# Patient Record
Sex: Female | Born: 1945 | Race: White | Hispanic: No | Marital: Married | Smoking: Never smoker
Health system: Southern US, Community
[De-identification: ages and names within clinical notes are randomized; demographics above are authoritative.]

## PROBLEM LIST (undated history)

## (undated) DIAGNOSIS — I639 Cerebral infarction, unspecified: Secondary | ICD-10-CM

## (undated) DIAGNOSIS — T7840XA Allergy, unspecified, initial encounter: Secondary | ICD-10-CM

## (undated) DIAGNOSIS — M1990 Unspecified osteoarthritis, unspecified site: Secondary | ICD-10-CM

## (undated) DIAGNOSIS — U071 COVID-19: Secondary | ICD-10-CM

## (undated) DIAGNOSIS — K469 Unspecified abdominal hernia without obstruction or gangrene: Secondary | ICD-10-CM

## (undated) DIAGNOSIS — Z8481 Family history of carrier of genetic disease: Secondary | ICD-10-CM

## (undated) DIAGNOSIS — Z86010 Personal history of colonic polyps: Secondary | ICD-10-CM

## (undated) DIAGNOSIS — K219 Gastro-esophageal reflux disease without esophagitis: Secondary | ICD-10-CM

## (undated) DIAGNOSIS — R32 Unspecified urinary incontinence: Secondary | ICD-10-CM

## (undated) DIAGNOSIS — J189 Pneumonia, unspecified organism: Secondary | ICD-10-CM

## (undated) DIAGNOSIS — N189 Chronic kidney disease, unspecified: Secondary | ICD-10-CM

## (undated) DIAGNOSIS — Z803 Family history of malignant neoplasm of breast: Secondary | ICD-10-CM

## (undated) DIAGNOSIS — K635 Polyp of colon: Secondary | ICD-10-CM

## (undated) DIAGNOSIS — K317 Polyp of stomach and duodenum: Secondary | ICD-10-CM

## (undated) DIAGNOSIS — E7800 Pure hypercholesterolemia, unspecified: Secondary | ICD-10-CM

## (undated) DIAGNOSIS — I10 Essential (primary) hypertension: Secondary | ICD-10-CM

## (undated) HISTORY — DX: Gastro-esophageal reflux disease without esophagitis: K21.9

## (undated) HISTORY — DX: Cerebral infarction, unspecified: I63.9

## (undated) HISTORY — DX: Family history of carrier of genetic disease: Z84.81

## (undated) HISTORY — DX: Allergy, unspecified, initial encounter: T78.40XA

## (undated) HISTORY — DX: Unspecified osteoarthritis, unspecified site: M19.90

## (undated) HISTORY — DX: COVID-19: U07.1

## (undated) HISTORY — PX: UPPER GASTROINTESTINAL ENDOSCOPY: SHX188

## (undated) HISTORY — PX: CHOLECYSTECTOMY: SHX55

## (undated) HISTORY — DX: Polyp of stomach and duodenum: K31.7

## (undated) HISTORY — PX: ABDOMINAL HYSTERECTOMY: SHX81

## (undated) HISTORY — DX: Personal history of colonic polyps: Z86.010

## (undated) HISTORY — PX: COLONOSCOPY: SHX174

## (undated) HISTORY — DX: Chronic kidney disease, unspecified: N18.9

## (undated) HISTORY — DX: Family history of malignant neoplasm of breast: Z80.3

## (undated) HISTORY — DX: Polyp of colon: K63.5

---

## 1996-01-25 HISTORY — PX: TRACHEOSTOMY: SUR1362

## 1996-01-25 HISTORY — PX: NISSEN FUNDOPLICATION: SHX2091

## 1997-04-24 ENCOUNTER — Encounter
Admission: RE | Admit: 1997-04-24 | Discharge: 1997-07-23 | Payer: Self-pay | Admitting: Physical Medicine and Rehabilitation

## 1997-11-10 ENCOUNTER — Inpatient Hospital Stay (HOSPITAL_COMMUNITY): Admission: RE | Admit: 1997-11-10 | Discharge: 1997-11-13 | Payer: Self-pay | Admitting: Orthopedic Surgery

## 1999-01-06 ENCOUNTER — Ambulatory Visit (HOSPITAL_COMMUNITY): Admission: RE | Admit: 1999-01-06 | Discharge: 1999-01-06 | Payer: Self-pay | Admitting: *Deleted

## 1999-01-21 ENCOUNTER — Other Ambulatory Visit: Admission: RE | Admit: 1999-01-21 | Discharge: 1999-01-21 | Payer: Self-pay | Admitting: *Deleted

## 1999-03-23 ENCOUNTER — Ambulatory Visit (HOSPITAL_COMMUNITY): Admission: RE | Admit: 1999-03-23 | Discharge: 1999-03-23 | Payer: Self-pay | Admitting: *Deleted

## 1999-03-23 ENCOUNTER — Encounter: Payer: Self-pay | Admitting: *Deleted

## 2000-12-07 ENCOUNTER — Encounter: Payer: Self-pay | Admitting: Family Medicine

## 2000-12-07 ENCOUNTER — Ambulatory Visit (HOSPITAL_COMMUNITY): Admission: RE | Admit: 2000-12-07 | Discharge: 2000-12-07 | Payer: Self-pay | Admitting: Family Medicine

## 2001-03-30 ENCOUNTER — Other Ambulatory Visit: Admission: RE | Admit: 2001-03-30 | Discharge: 2001-03-30 | Payer: Self-pay | Admitting: Family Medicine

## 2001-04-05 ENCOUNTER — Encounter: Admission: RE | Admit: 2001-04-05 | Discharge: 2001-04-05 | Payer: Self-pay | Admitting: Family Medicine

## 2001-04-05 ENCOUNTER — Encounter: Payer: Self-pay | Admitting: Family Medicine

## 2009-04-09 ENCOUNTER — Ambulatory Visit: Payer: Self-pay | Admitting: Diagnostic Radiology

## 2009-04-09 ENCOUNTER — Ambulatory Visit (HOSPITAL_BASED_OUTPATIENT_CLINIC_OR_DEPARTMENT_OTHER): Admission: RE | Admit: 2009-04-09 | Discharge: 2009-04-09 | Payer: Self-pay | Admitting: Family Medicine

## 2009-10-15 ENCOUNTER — Ambulatory Visit (HOSPITAL_COMMUNITY): Admission: RE | Admit: 2009-10-15 | Discharge: 2009-10-15 | Payer: Self-pay | Admitting: Family Medicine

## 2010-11-15 ENCOUNTER — Other Ambulatory Visit (HOSPITAL_COMMUNITY): Payer: Self-pay | Admitting: Family Medicine

## 2010-11-15 DIAGNOSIS — Z1231 Encounter for screening mammogram for malignant neoplasm of breast: Secondary | ICD-10-CM

## 2010-12-07 ENCOUNTER — Ambulatory Visit (HOSPITAL_COMMUNITY)
Admission: RE | Admit: 2010-12-07 | Discharge: 2010-12-07 | Disposition: A | Discharge status: Home / Self Care | Payer: Medicare Other | Source: Ambulatory Visit | Attending: Family Medicine | Admitting: Family Medicine

## 2010-12-07 DIAGNOSIS — Z1231 Encounter for screening mammogram for malignant neoplasm of breast: Secondary | ICD-10-CM | POA: Insufficient documentation

## 2011-01-19 ENCOUNTER — Emergency Department (HOSPITAL_COMMUNITY)
Admission: EM | Admit: 2011-01-19 | Discharge: 2011-01-19 | Disposition: A | Discharge status: Home / Self Care | Payer: Medicare Other | Attending: Emergency Medicine | Admitting: Emergency Medicine

## 2011-01-19 ENCOUNTER — Encounter: Payer: Self-pay | Admitting: Emergency Medicine

## 2011-01-19 ENCOUNTER — Other Ambulatory Visit: Payer: Self-pay

## 2011-01-19 DIAGNOSIS — Z79899 Other long term (current) drug therapy: Secondary | ICD-10-CM | POA: Insufficient documentation

## 2011-01-19 DIAGNOSIS — R5381 Other malaise: Secondary | ICD-10-CM | POA: Insufficient documentation

## 2011-01-19 DIAGNOSIS — I10 Essential (primary) hypertension: Secondary | ICD-10-CM | POA: Insufficient documentation

## 2011-01-19 DIAGNOSIS — R531 Weakness: Secondary | ICD-10-CM

## 2011-01-19 DIAGNOSIS — E7800 Pure hypercholesterolemia, unspecified: Secondary | ICD-10-CM | POA: Insufficient documentation

## 2011-01-19 HISTORY — DX: Pure hypercholesterolemia, unspecified: E78.00

## 2011-01-19 HISTORY — DX: Unspecified urinary incontinence: R32

## 2011-01-19 HISTORY — DX: Essential (primary) hypertension: I10

## 2011-01-19 LAB — DIFFERENTIAL
Basophils Absolute: 0.1 10*3/uL (ref 0.0–0.1)
Basophils Relative: 1 % (ref 0–1)
Neutro Abs: 7.8 10*3/uL — ABNORMAL HIGH (ref 1.7–7.7)
Neutrophils Relative %: 71 % (ref 43–77)

## 2011-01-19 LAB — BASIC METABOLIC PANEL
Chloride: 104 mEq/L (ref 96–112)
Creatinine, Ser: 1.77 mg/dL — ABNORMAL HIGH (ref 0.50–1.10)
GFR calc Af Amer: 34 mL/min — ABNORMAL LOW (ref >90)
Potassium: 3.9 mEq/L (ref 3.5–5.1)
Sodium: 137 mEq/L (ref 135–145)

## 2011-01-19 LAB — URINALYSIS, ROUTINE W REFLEX MICROSCOPIC
Ketones, ur: NEGATIVE mg/dL
Nitrite: NEGATIVE
Specific Gravity, Urine: 1.02 (ref 1.005–1.030)
pH: 6.5 (ref 5.0–8.0)

## 2011-01-19 LAB — CBC
MCHC: 32 g/dL (ref 30.0–36.0)
RDW: 14.2 % (ref 11.5–15.5)

## 2011-01-19 LAB — URINE MICROSCOPIC-ADD ON

## 2011-01-19 MED ORDER — SODIUM CHLORIDE 0.9 % IV BOLUS (SEPSIS)
500.0000 mL | Freq: Once | INTRAVENOUS | Status: AC
  Administered 2011-01-19: 500 mL via INTRAVENOUS

## 2011-01-19 MED ORDER — SODIUM CHLORIDE 0.9 % IV SOLN
INTRAVENOUS | Status: DC
  Administered 2011-01-19: 17:00:00 via INTRAVENOUS

## 2011-01-19 MED ORDER — ONDANSETRON HCL 4 MG/2ML IJ SOLN
4.0000 mg | Freq: Once | INTRAMUSCULAR | Status: AC
  Administered 2011-01-19: 4 mg via INTRAVENOUS
  Filled 2011-01-19: qty 2

## 2011-01-19 NOTE — ED Provider Notes (Signed)
History     CSN: [id number]  Arrival date & time 01/19/11  1552   First MD Initiated Contact with Patient 01/19/11 1609      Chief Complaint  Patient presents with  . Fatigue    (Consider location/radiation/quality/duration/timing/severity/associated sxs/prior treatment) HPI Comments: She was at her urologist office, getting acupuncture to her feet for a bladder problem, when she began to feel weak. She was transferred here by EMS, for evaluation. No paperwork was transferred with her. She does not feeling better since arrival.  Patient is a 65 y.o. female presenting with weakness.  Weakness The symptoms began less than 1 hour ago. The symptoms are unchanged. The neurological symptoms are diffuse.  Additional symptoms include weakness. Additional symptoms do not include lower back pain, leg pain, tinnitus, vertigo, anxiety or dysphoric mood.   during EMS transport. She received a 250 cc bolus. She has had feelings like this in the past, but never while getting acupuncture. She denies illness recently, including headache, cough, shortness of breath, chest pain, abdominal pain, back pain, change in bowel or urinary habits. Her last bowel movement was this morning, and normal.  Past Medical History  Diagnosis Date  . Hypertension   . Hypercholesterolemia   . Incontinence     Past Surgical History  Procedure Date  . Fundoplication 1998    total    History reviewed. No pertinent family history.  History  Substance Use Topics  . Smoking status: Never Smoker   . Smokeless tobacco: Never Used  . Alcohol Use: No    OB History    Grav Para Term Preterm Abortions TAB SAB Ect Mult Living                  Review of Systems  HENT: Negative for tinnitus.   Neurological: Positive for weakness. Negative for vertigo.  Psychiatric/Behavioral: Negative for dysphoric mood.  All other systems reviewed and are negative.    Allergies  Review of patient's allergies indicates no  known allergies.  Home Medications   Current Outpatient Rx  Name Route Sig Dispense Refill  . EZETIMIBE 10 MG PO TABS Oral Take 10 mg by mouth daily.      . OXYBUTYNIN CHLORIDE ER 10 MG PO TB24 Oral Take 10 mg by mouth daily.      Marland Kitchen PRAVASTATIN SODIUM 80 MG PO TABS Oral Take 80 mg by mouth daily.      Marland Kitchen VALSARTAN-HYDROCHLOROTHIAZIDE 320-12.5 MG PO TABS Oral Take 1 tablet by mouth daily.        BP 115/68  Pulse 87  Temp(Src) 97.1 F (36.2 C) (Axillary)  Resp 20  SpO2 97%  Physical Exam  Nursing note and vitals reviewed. Constitutional: She is oriented to person, place, and time. She appears well-developed and well-nourished.       Mild weakness, requiring help to sit up, but then she can't support herself in a seated position  HENT:  Head: Normocephalic and atraumatic.  Eyes: Conjunctivae and EOM are normal. Pupils are equal, round, and reactive to light.  Neck: Normal range of motion and phonation normal. Neck supple.  Cardiovascular: Normal rate, regular rhythm and intact distal pulses.   Pulmonary/Chest: Effort normal and breath sounds normal. She exhibits no tenderness.  Abdominal: Soft. Bowel sounds are normal. She exhibits no distension. There is no tenderness. There is no guarding.  Musculoskeletal: Normal range of motion.       Feet are without visible wounds from her recent acupuncture  Neurological: She  is alert and oriented to person, place, and time. She has normal strength and normal reflexes. She exhibits normal muscle tone.  Skin: Skin is warm and dry.  Psychiatric: Her behavior is normal. Judgment and thought content normal.       She appears sad    ED Course  Procedures (including critical care time) ED treatment: IV fluids. At discharge. Patient was alert, calm, cooperative, and mature without distress and tolerating liquids. Labs Reviewed  CBC - Abnormal; Notable for the following:    WBC 11.0 (*)    RBC 3.47 (*)    Hemoglobin 10.3 (*)    HCT 32.2 (*)      All other components within normal limits  DIFFERENTIAL - Abnormal; Notable for the following:    Neutro Abs 7.8 (*)    All other components within normal limits  BASIC METABOLIC PANEL - Abnormal; Notable for the following:    Glucose, Bld 116 (*)    BUN 33 (*)    Creatinine, Ser 1.77 (*)    GFR calc non Af Amer 29 (*)    GFR calc Af Amer 34 (*)    All other components within normal limits  URINALYSIS, ROUTINE W REFLEX MICROSCOPIC - Abnormal; Notable for the following:    Leukocytes, UA TRACE (*)    All other components within normal limits  URINE MICROSCOPIC-ADD ON  URINE CULTURE   No results found.   1. Weakness       MDM  Nonspecific weakness, with negative EP evaluation. Doubt toxic, metabolic or infectious processes.        Flint Melter, MD 01/19/11 2107

## 2011-01-19 NOTE — ED Notes (Signed)
Pt goes to Dr. Hollie Beach office Q3wks for accupuncture to feet for bladder control problems.  States she has been feeling fine.  Denies being sick at all.  States the procedure was finished, the nurse came in to remove the needle(s) and she became nauseated and weak.  Family states she is normally pale, but she was very pale then.  Family states her color has returned.  Pt c/o feeling weak, wanting to sleep and nausea-states she is unable to vomit b/c of a procedure she's had in the past.  Denies any pain. Denies noticing any blood in urine or stools.

## 2011-01-19 NOTE — ED Notes (Signed)
Pt was having a procedure done at urology center and became weak denies any pain, states that this happened 2 yrs ago but nothing was done, alert x4, iv 20g rt ac ns 200 cc bolus given bp 108/68, dizzyness, pt pale

## 2011-01-21 LAB — URINE CULTURE: Culture  Setup Time: 201212270436

## 2011-06-02 ENCOUNTER — Encounter (INDEPENDENT_AMBULATORY_CARE_PROVIDER_SITE_OTHER): Payer: Self-pay | Admitting: Surgery

## 2011-06-02 ENCOUNTER — Ambulatory Visit (INDEPENDENT_AMBULATORY_CARE_PROVIDER_SITE_OTHER): Payer: Medicare Other | Admitting: Surgery

## 2011-06-02 VITALS — BP 140/82 | HR 76 | Temp 98.5°F | Resp 16 | Ht 62.5 in | Wt 170.6 lb

## 2011-06-02 DIAGNOSIS — K223 Perforation of esophagus: Secondary | ICD-10-CM

## 2011-06-02 DIAGNOSIS — Z930 Tracheostomy status: Secondary | ICD-10-CM

## 2011-06-02 DIAGNOSIS — Z9889 Other specified postprocedural states: Secondary | ICD-10-CM

## 2011-06-02 DIAGNOSIS — Z931 Gastrostomy status: Secondary | ICD-10-CM | POA: Insufficient documentation

## 2011-06-02 DIAGNOSIS — Z8673 Personal history of transient ischemic attack (TIA), and cerebral infarction without residual deficits: Secondary | ICD-10-CM

## 2011-06-02 HISTORY — DX: Gastrostomy status: Z93.1

## 2011-06-02 HISTORY — DX: Other specified postprocedural states: Z98.890

## 2011-06-02 HISTORY — DX: Personal history of transient ischemic attack (TIA), and cerebral infarction without residual deficits: Z86.73

## 2011-06-02 NOTE — Progress Notes (Signed)
Chief Complaint:  Recent recurrent GER  History of Present Illness:  Kristie Anderson is an 66 y.o. female who underwent a Nissen by Dr. Lurene Shadow complicated by esophageal puncture and prolonged ventilation (Sept-Jan 99)  She has recently developed recurrent GER and is on Dexilant  Past Medical History  Diagnosis Date  . Hypertension   . Hypercholesterolemia   . Incontinence   . GERD (gastroesophageal reflux disease)   . Arthritis   . Stroke     Past Surgical History  Procedure Date  . Fundoplication 1998    total  . Cholecystectomy     Current Outpatient Prescriptions  Medication Sig Dispense Refill  . aspirin 81 MG tablet Take 81 mg by mouth daily.      . Cholecalciferol (VITAMIN D PO) Take by mouth daily.      [name] 18-103 MCG/ACT inhaler Ad lib.      Marland Kitchen dexlansoprazole (DEXILANT) 60 MG capsule Take 60 mg by mouth daily.      Marland Kitchen ezetimibe (ZETIA) 10 MG tablet Take 10 mg by mouth daily.        Marland Kitchen loratadine (CLARITIN) 10 MG tablet Take 10 mg by mouth as needed.      Marland Kitchen oxybutynin (DITROPAN-XL) 10 MG 24 hr tablet Take 10 mg by mouth daily.        . pravastatin (PRAVACHOL) 80 MG tablet Take 80 mg by mouth daily.        . promethazine (PHENERGAN) 12.5 MG tablet Ad lib.      . valsartan-hydrochlorothiazide (DIOVAN-HCT) 320-12.5 MG per tablet Take 1 tablet by mouth daily.         Review of patient's allergies indicates no known allergies. Family History  Problem Relation Age of Onset  . Heart disease Mother   . Cancer Mother     breast  . Heart disease Father   . Cancer Daughter     breast   Social History:   reports that she has never smoked. She has never used smokeless tobacco. She reports that she does not drink alcohol or use illicit drugs.   REVIEW OF SYSTEMS - PERTINENT POSITIVES ONLY: Will try to retrieve old chart  Physical Exam:   Blood pressure 140/82, pulse 76, temperature 98.5 F (36.9 C), temperature source Temporal, resp. rate 16, height 5' 2.5" (1.588  m), weight 170 lb 9.6 oz (77.384 kg). Body mass index is 30.71 kg/(m^2).  Gen:  WDWN WF NAD  Neurological: Alert and oriented to person, place, and time. Motor and sensory function is grossly intact  Head: Normocephalic and atraumatic.  Eyes: Conjunctivae are normal. Pupils are equal, round, and reactive to light. No scleral icterus.  Neck: Normal range of motion. Neck supple. No tracheal deviation or thyromegaly present.  Cardiovascular:  SR without murmurs or gallops.  No carotid bruits Respiratory: Effort normal.  No respiratory distress. No chest wall tenderness. Breath sounds normal.  No wheezes, rales or rhonchi.  Abdomen:  nontender GU: Musculoskeletal: Normal range of motion. Extremities are nontender. No cyanosis, edema or clubbing noted Lymphadenopathy: No cervical, preauricular, postauricular or axillary adenopathy is present Skin: Skin is warm and dry. No rash noted. No diaphoresis. No erythema. No pallor. Pscyh: Normal mood and affect. Behavior is normal. Judgment and thought content normal.   LABORATORY RESULTS: No results found for this or any previous visit (from the past 48 hour(s)).  RADIOLOGY RESULTS: No results found.  Problem List: There is no problem list on file for this patient.  Assessment & Plan: Recurrent GER after apparent attempted Nissen in 1999.  First step in this reevaluation is to get an UGI and I will see her after that.      Matt B. Daphine Deutscher, MD, [facility] [phone] beeper [phone]  06/02/2011 10:38 AM

## 2011-06-06 ENCOUNTER — Ambulatory Visit
Admission: RE | Admit: 2011-06-06 | Discharge: 2011-06-06 | Disposition: A | Discharge status: Home / Self Care | Payer: Medicare Other | Source: Ambulatory Visit | Attending: Surgery | Admitting: Surgery

## 2011-06-06 DIAGNOSIS — K223 Perforation of esophagus: Secondary | ICD-10-CM

## 2011-06-23 ENCOUNTER — Ambulatory Visit (INDEPENDENT_AMBULATORY_CARE_PROVIDER_SITE_OTHER): Payer: Medicare Other | Admitting: Surgery

## 2011-06-23 ENCOUNTER — Encounter (INDEPENDENT_AMBULATORY_CARE_PROVIDER_SITE_OTHER): Payer: Self-pay | Admitting: Surgery

## 2011-06-23 VITALS — BP 140/80 | HR 84 | Temp 98.0°F | Ht 62.5 in | Wt 172.2 lb

## 2011-06-23 DIAGNOSIS — R143 Flatulence: Secondary | ICD-10-CM

## 2011-06-23 DIAGNOSIS — R141 Gas pain: Secondary | ICD-10-CM

## 2011-06-23 DIAGNOSIS — R140 Abdominal distension (gaseous): Secondary | ICD-10-CM

## 2011-06-23 DIAGNOSIS — K222 Esophageal obstruction: Secondary | ICD-10-CM

## 2011-06-23 NOTE — Progress Notes (Signed)
Kristie Anderson return today I reviewed her upper GI series. He showed a stricture at the site of her previous perforation by Dr. Lurene Shadow. Her main complaints have been bloating and discomfort in her upper abdomen. I think she would benefit from an endoscopy and to check her for H. pylori. Will refer  her to Dr. Matthias Hughs for this.  Impression: Bloating and distention.  Survived Nissen perforation by [employer].  Now with stricture of esophagus.  Plan:  Refer to Dr. Matthias Hughs for evaluation and EGD she were to lower rate it for you on top of it

## 2011-08-25 ENCOUNTER — Encounter (INDEPENDENT_AMBULATORY_CARE_PROVIDER_SITE_OTHER): Payer: Medicare Other | Admitting: Surgery

## 2011-09-06 ENCOUNTER — Encounter (INDEPENDENT_AMBULATORY_CARE_PROVIDER_SITE_OTHER): Payer: Self-pay

## 2011-09-22 ENCOUNTER — Encounter (INDEPENDENT_AMBULATORY_CARE_PROVIDER_SITE_OTHER): Payer: Medicare Other | Admitting: Surgery

## 2011-11-21 ENCOUNTER — Other Ambulatory Visit (HOSPITAL_COMMUNITY): Payer: Self-pay | Admitting: Family Medicine

## 2011-11-21 DIAGNOSIS — Z1231 Encounter for screening mammogram for malignant neoplasm of breast: Secondary | ICD-10-CM

## 2011-12-09 ENCOUNTER — Ambulatory Visit (HOSPITAL_COMMUNITY): Payer: Medicare Other

## 2012-01-02 ENCOUNTER — Ambulatory Visit (HOSPITAL_COMMUNITY)
Admission: RE | Admit: 2012-01-02 | Discharge: 2012-01-02 | Disposition: A | Discharge status: Home / Self Care | Payer: Medicare Other | Source: Ambulatory Visit | Attending: Family Medicine | Admitting: Family Medicine

## 2012-01-02 DIAGNOSIS — Z1231 Encounter for screening mammogram for malignant neoplasm of breast: Secondary | ICD-10-CM | POA: Insufficient documentation

## 2012-02-14 ENCOUNTER — Other Ambulatory Visit: Payer: Self-pay | Admitting: Nephrology

## 2012-02-14 DIAGNOSIS — I10 Essential (primary) hypertension: Secondary | ICD-10-CM

## 2012-02-16 ENCOUNTER — Other Ambulatory Visit: Payer: Medicare Other

## 2012-02-21 ENCOUNTER — Ambulatory Visit
Admission: RE | Admit: 2012-02-21 | Discharge: 2012-02-21 | Disposition: A | Discharge status: Home / Self Care | Payer: Medicare Other | Source: Ambulatory Visit | Attending: Nephrology | Admitting: Nephrology

## 2012-02-21 DIAGNOSIS — I10 Essential (primary) hypertension: Secondary | ICD-10-CM

## 2012-05-19 ENCOUNTER — Other Ambulatory Visit: Payer: Self-pay | Admitting: Family Medicine

## 2012-05-23 ENCOUNTER — Telehealth: Payer: Self-pay | Admitting: *Deleted

## 2012-05-23 NOTE — Telephone Encounter (Signed)
PT NEEDS REFILLS DONE THIS WEEK GOING OUT OF TOWN NEXT WEEK IS OUT OF RX'S -  PANTOPRAZOLE SODIUM-  FENOFIBRIC-  PHARMACY USED- [facility] [address] [place]

## 2012-05-26 ENCOUNTER — Other Ambulatory Visit: Payer: Self-pay | Admitting: Family Medicine

## 2012-06-04 NOTE — Telephone Encounter (Signed)
PT CALLED AGAIN AND SAID THAT SHE NEVER GOT THESE RX'S CALLED IN -  SEE FIRST NOTE- PT ALSO NEEDS A REFILL ON  ZETIA 10MG - GENERIC [facility] ON [address] IN [place] PLEASE ADVISE AND OR CONTACT PT.  THANKS  PT# [phone]

## 2012-06-05 ENCOUNTER — Telehealth: Payer: Self-pay | Admitting: *Deleted

## 2012-06-05 MED ORDER — PANTOPRAZOLE SODIUM 40 MG PO TBEC: 40.0000 mg | DELAYED_RELEASE_TABLET | Freq: Every day | ORAL | Status: DC

## 2012-06-05 MED ORDER — CHOLINE FENOFIBRATE 135 MG PO CPDR: 135.0000 mg | DELAYED_RELEASE_CAPSULE | Freq: Every day | ORAL | Status: DC

## 2012-06-05 MED ORDER — EZETIMIBE 10 MG PO TABS: 10.0000 mg | ORAL_TABLET | Freq: Every day | ORAL | Status: DC

## 2012-06-05 NOTE — Telephone Encounter (Signed)
Please let her know we can only refill her meds for 30 days.  She needs an appt to get all her meds refilled. Make sure to remind her to bring all her med bottles. Thanks

## 2012-06-21 ENCOUNTER — Other Ambulatory Visit: Payer: Self-pay | Admitting: *Deleted

## 2012-06-21 DIAGNOSIS — I10 Essential (primary) hypertension: Secondary | ICD-10-CM

## 2012-06-21 DIAGNOSIS — E785 Hyperlipidemia, unspecified: Secondary | ICD-10-CM

## 2012-06-22 ENCOUNTER — Other Ambulatory Visit: Payer: Medicare Other

## 2012-06-22 LAB — COMPREHENSIVE METABOLIC PANEL
ALT: 10 U/L (ref 0–35)
AST: 16 U/L (ref 0–37)
Albumin: 4.3 g/dL (ref 3.5–5.2)
Alkaline Phosphatase: 50 U/L (ref 39–117)
BUN: 28 mg/dL — ABNORMAL HIGH (ref 6–23)
CO2: 29 mEq/L (ref 19–32)
Calcium: 9.8 mg/dL (ref 8.4–10.5)
Chloride: 103 mEq/L (ref 96–112)
Creat: 1.51 mg/dL — ABNORMAL HIGH (ref 0.50–1.10)
Glucose, Bld: 88 mg/dL (ref 70–99)
Potassium: 4.6 mEq/L (ref 3.5–5.3)
Sodium: 141 mEq/L (ref 135–145)
Total Bilirubin: 0.4 mg/dL (ref 0.3–1.2)
Total Protein: 6.9 g/dL (ref 6.0–8.3)

## 2012-06-22 LAB — LIPID PANEL
Cholesterol: 138 mg/dL (ref 0–200)
HDL: 47 mg/dL (ref >39)
LDL Cholesterol: 70 mg/dL (ref 0–99)
Total CHOL/HDL Ratio: 2.9 Ratio
Triglycerides: 107 mg/dL (ref <150)
VLDL: 21 mg/dL (ref 0–40)

## 2012-06-23 LAB — MICROALBUMIN, URINE: Microalb, Ur: 0.5 mg/dL (ref 0.00–1.89)

## 2012-06-29 ENCOUNTER — Encounter: Payer: Self-pay | Admitting: Family Medicine

## 2012-06-29 ENCOUNTER — Ambulatory Visit (INDEPENDENT_AMBULATORY_CARE_PROVIDER_SITE_OTHER): Payer: Medicare Other | Admitting: Family Medicine

## 2012-06-29 VITALS — BP 134/78 | HR 68 | Wt 168.0 lb

## 2012-06-29 DIAGNOSIS — I10 Essential (primary) hypertension: Secondary | ICD-10-CM

## 2012-06-29 DIAGNOSIS — K219 Gastro-esophageal reflux disease without esophagitis: Secondary | ICD-10-CM

## 2012-06-29 DIAGNOSIS — E785 Hyperlipidemia, unspecified: Secondary | ICD-10-CM

## 2012-06-29 MED ORDER — CHOLINE FENOFIBRATE 135 MG PO CPDR: 135.0000 mg | DELAYED_RELEASE_CAPSULE | Freq: Every day | ORAL | Status: DC

## 2012-06-29 MED ORDER — PRAVASTATIN SODIUM 80 MG PO TABS: 80.0000 mg | ORAL_TABLET | Freq: Every day | ORAL | Status: DC

## 2012-06-29 MED ORDER — IRBESARTAN 150 MG PO TABS: 150.0000 mg | ORAL_TABLET | Freq: Every day | ORAL | Status: DC

## 2012-06-29 MED ORDER — EZETIMIBE 10 MG PO TABS: 10.0000 mg | ORAL_TABLET | Freq: Every day | ORAL | Status: DC

## 2012-06-29 MED ORDER — PANTOPRAZOLE SODIUM 40 MG PO TBEC: 40.0000 mg | DELAYED_RELEASE_TABLET | Freq: Every day | ORAL | Status: DC

## 2012-06-29 NOTE — Patient Instructions (Addendum)
1)  Cholesterol - Your numbers are perfect so I would stay on your current dosages assuming you feel good on them.   2)  Kidney Function - Your kidney function has improved from your last test in the fall.  Please give Dr. Darrick Penna a copy of your labs.     Kidney Disease, Adult The kidneys are two organs that lie on either side of the spine between the middle of the back and the front of the abdomen. The kidneys:   Remove wastes and extra water from the blood.   Produce important hormones. These regulate blood pressure, help keep bones strong, and help create red blood cells.   Balance the fluids and chemicals in the blood and tissues. Kidney disease occurs when the kidneys are damaged. Kidney damage may be sudden (acute) or develop over a long period (chronic). A small amount of damage may not cause problems, but a large amount of damage may make it difficult or impossible for the kidneys to work the way they should. Early detection and treatment of kidney disease may prevent kidney damage from becoming permanent or getting worse. Some kidney diseases are curable, but most are not. Many people with kidney disease are able to control the disease and live a normal life.  TYPES OF KIDNEY DISEASE  Acute kidney injury.Acute kidney injury occurs when there is sudden damage to the kidneys.  Chronic kidney disease. Chronic kidney disease occurs when the kidneys are damaged over a long period.  End-stage kidney disease. End-stage kidney disease occurs when the kidneys are so damaged that they stop working. In end-stage kidney disease, the kidneys cannot get better. CAUSES Any condition, disease, or event that damages the kidneys may cause kidney disease. Acute kidney injury.  A problem with blood flow to the kidneys. This may be caused by:   Blood loss.   Heart disease.   Severe burns.   Liver disease.  Direct damage to the kidneys. This may be caused by:  Some medicines.   A  kidney infection.   Poisoning or consuming toxic substances.   A surgical wound.   A blow to the kidney area.   A problem with urine flow. This may be caused by:   Cancer.   Kidney stones.   An enlarged prostate. Chronic kidney disease. The most common causes of chronic kidney disease are diabetes and high blood pressure (hypertension). Chronic kidney disease may also be caused by:   Diseases that cause the filtering units of the kidneys to become inflamed.   Diseases that affect the immune system.   Genetic diseases.   Medicines that damage the kidneys, such as anti-inflammatory medicines.  Poisoning or exposure to toxic substances.   A reoccurring kidney or urinary infection.   A problem with urine flow. This may be caused by:  Cancer.   Kidney stones.   An enlarged prostate in males. End-stage kidney disease. This kidney disease usually occurs when a chronic kidney disease gets worse. It may also occur after acute kidney injury.  SYMPTOMS   Swelling (edema) of the legs, ankles, or feet.   Tiredness (lethargy).   Nausea or vomiting.   Confusion.   Problems with urination, such as:   Painful or burning feeling during urination.   Decreased urine production.  Bloody urine.   Frequent urination, especially at night.  Hypertension.  Muscle twitches and cramps.   Shortness of breath.   Persistent itchiness.   Loss of appetite.  Metallic taste in the  mouth.   Weakness.   Seizures.   Chest pain or pressure.   Trouble sleeping.   Headaches.   Abnormally dark or light skin.   Numbness in the hands or feet.   Easy bruising.   Frequent hiccups.   Menstruation stops. Sometimes, no symptoms are present. DIAGNOSIS  Kidney disease may be detected and diagnosed by tests, including blood, urine, imaging, or kidney biopsy tests.  TREATMENT  Acute kidney injury. Treatment of acute kidney injury varies  depending on the cause and severity of the kidney damage. In mild cases, no treatment may be needed. The kidneys may heal on their own. If acute kidney injury is more severe, your caregiver will treat the cause of the kidney damage, help the kidneys heal, and prevent complications from occurring. Severe cases may require a procedure to remove toxic wastes from the body (dialysis) or surgery to repair kidney damage. Surgery may involve:   Repair of a torn kidney.   Removal of an obstruction.  Most of the time, you will need to stay overnight at the hospital.  Chronic kidney disease. Most chronic kidney diseases cannot be cured. Treatment usually involves relieving symptoms and preventing or slowing the progression of the disease. Treatment may include:   A special diet. You may need to avoid alcohol and foods that:   Have added salt.   Are high in potassium.   Are high in protein.   Medicines. These may:   Lower blood pressure.   Relieve anemia.   Relieve swelling.   Protect the bones.  End-stage kidney disease. End-stage kidney disease is life-threatening and must be treated immediately. There are two treatments for end-stage kidney disease:   Dialysis.   Receiving a new kidney (kidney transplant). Both of these treatments have serious risks and consequences. In addition to having dialysis or a kidney transplant, you may need to take medicines to control hypertension and cholesterol and to decrease phosphorus levels in your blood. LENGTH OF ILLNESS  Acute kidney injury.The length of this disease varies greatly from person to person. Exactly how long it lasts depends on the cause of the kidney damage. Acute kidney injury may develop into chronic kidney disease or end-stage kidney disease.  Chronic kidney disease. This disease usually lasts a lifetime. Chronic kidney disease may worsen over time to become end-stage kidney disease. The time it takes for end-stage kidney  disease to develop varies from person to person.  End-stage kidney disease. This disease lasts until a kidney transplant is performed. PREVENTION  Kidney disease can sometimes be prevented. If you have diabetes, hypertension, or any other condition that may lead to kidney disease, you should try to prevent kidney disease with:   An appropriate diet.  Medicine.  Lifestyle changes. FOR MORE INFORMATION  American Association of Kidney Patients: ResidentialShow.is  National Kidney Foundation: www.kidney.org  American Kidney Fund: FightingMatch.com.ee  Life Options Rehabilitation Program: www.lifeoptions.org and www.kidneyschool.org  Document Released: 01/10/2005 Document Revised: 12/28/2011 Document Reviewed: 09/09/2011 [facility] Patient Information 2014 [place], [place]. Kidney Disease, Adult The kidneys are two organs that lie on either side of the spine between the middle of the back and the front of the abdomen. The kidneys:   Remove wastes and extra water from the blood.   Produce important hormones. These regulate blood pressure, help keep bones strong, and help create red blood cells.   Balance the fluids and chemicals in the blood and tissues. Kidney disease occurs when the kidneys are damaged. Kidney damage may be sudden (  acute) or develop over a long period (chronic). A small amount of damage may not cause problems, but a large amount of damage may make it difficult or impossible for the kidneys to work the way they should. Early detection and treatment of kidney disease may prevent kidney damage from becoming permanent or getting worse. Some kidney diseases are curable, but most are not. Many people with kidney disease are able to control the disease and live a normal life.  TYPES OF KIDNEY DISEASE  Acute kidney injury.Acute kidney injury occurs when there is sudden damage to the kidneys.  Chronic kidney disease. Chronic kidney disease occurs when the kidneys are damaged over a long  period.  End-stage kidney disease. End-stage kidney disease occurs when the kidneys are so damaged that they stop working. In end-stage kidney disease, the kidneys cannot get better. CAUSES Any condition, disease, or event that damages the kidneys may cause kidney disease. Acute kidney injury.  A problem with blood flow to the kidneys. This may be caused by:   Blood loss.   Heart disease.   Severe burns.   Liver disease.  Direct damage to the kidneys. This may be caused by:  Some medicines.   A kidney infection.   Poisoning or consuming toxic substances.   A surgical wound.   A blow to the kidney area.   A problem with urine flow. This may be caused by:   Cancer.   Kidney stones.   An enlarged prostate. Chronic kidney disease. The most common causes of chronic kidney disease are diabetes and high blood pressure (hypertension). Chronic kidney disease may also be caused by:   Diseases that cause the filtering units of the kidneys to become inflamed.   Diseases that affect the immune system.   Genetic diseases.   Medicines that damage the kidneys, such as anti-inflammatory medicines.  Poisoning or exposure to toxic substances.   A reoccurring kidney or urinary infection.   A problem with urine flow. This may be caused by:  Cancer.   Kidney stones.   An enlarged prostate in males. End-stage kidney disease. This kidney disease usually occurs when a chronic kidney disease gets worse. It may also occur after acute kidney injury.  SYMPTOMS   Swelling (edema) of the legs, ankles, or feet.   Tiredness (lethargy).   Nausea or vomiting.   Confusion.   Problems with urination, such as:   Painful or burning feeling during urination.   Decreased urine production.  Bloody urine.   Frequent urination, especially at night.  Hypertension.  Muscle twitches and cramps.   Shortness of breath.   Persistent itchiness.    Loss of appetite.  Metallic taste in the mouth.   Weakness.   Seizures.   Chest pain or pressure.   Trouble sleeping.   Headaches.   Abnormally dark or light skin.   Numbness in the hands or feet.   Easy bruising.   Frequent hiccups.   Menstruation stops. Sometimes, no symptoms are present. DIAGNOSIS  Kidney disease may be detected and diagnosed by tests, including blood, urine, imaging, or kidney biopsy tests.  TREATMENT  Acute kidney injury. Treatment of acute kidney injury varies depending on the cause and severity of the kidney damage. In mild cases, no treatment may be needed. The kidneys may heal on their own. If acute kidney injury is more severe, your caregiver will treat the cause of the kidney damage, help the kidneys heal, and prevent complications from occurring. Severe cases may require  a procedure to remove toxic wastes from the body (dialysis) or surgery to repair kidney damage. Surgery may involve:   Repair of a torn kidney.   Removal of an obstruction.  Most of the time, you will need to stay overnight at the hospital.  Chronic kidney disease. Most chronic kidney diseases cannot be cured. Treatment usually involves relieving symptoms and preventing or slowing the progression of the disease. Treatment may include:   A special diet. You may need to avoid alcohol and foods that:   Have added salt.   Are high in potassium.   Are high in protein.   Medicines. These may:   Lower blood pressure.   Relieve anemia.   Relieve swelling.   Protect the bones.  End-stage kidney disease. End-stage kidney disease is life-threatening and must be treated immediately. There are two treatments for end-stage kidney disease:   Dialysis.   Receiving a new kidney (kidney transplant). Both of these treatments have serious risks and consequences. In addition to having dialysis or a kidney transplant, you may need to take medicines to  control hypertension and cholesterol and to decrease phosphorus levels in your blood. LENGTH OF ILLNESS  Acute kidney injury.The length of this disease varies greatly from person to person. Exactly how long it lasts depends on the cause of the kidney damage. Acute kidney injury may develop into chronic kidney disease or end-stage kidney disease.  Chronic kidney disease. This disease usually lasts a lifetime. Chronic kidney disease may worsen over time to become end-stage kidney disease. The time it takes for end-stage kidney disease to develop varies from person to person.  End-stage kidney disease. This disease lasts until a kidney transplant is performed. PREVENTION  Kidney disease can sometimes be prevented. If you have diabetes, hypertension, or any other condition that may lead to kidney disease, you should try to prevent kidney disease with:   An appropriate diet.  Medicine.  Lifestyle changes. FOR MORE INFORMATION  American Association of Kidney Patients: ResidentialShow.is  National Kidney Foundation: www.kidney.org  American Kidney Fund: FightingMatch.com.ee  Life Options Rehabilitation Program: www.lifeoptions.org and www.kidneyschool.org  Document Released: 01/10/2005 Document Revised: 12/28/2011 Document Reviewed: 09/09/2011 [facility] Patient Information 2014 [place], [place]. Kidney Disease, Adult The kidneys are two organs that lie on either side of the spine between the middle of the back and the front of the abdomen. The kidneys:   Remove wastes and extra water from the blood.   Produce important hormones. These regulate blood pressure, help keep bones strong, and help create red blood cells.   Balance the fluids and chemicals in the blood and tissues. Kidney disease occurs when the kidneys are damaged. Kidney damage may be sudden (acute) or develop over a long period (chronic). A small amount of damage may not cause problems, but a large amount of damage may make it difficult or  impossible for the kidneys to work the way they should. Early detection and treatment of kidney disease may prevent kidney damage from becoming permanent or getting worse. Some kidney diseases are curable, but most are not. Many people with kidney disease are able to control the disease and live a normal life.  TYPES OF KIDNEY DISEASE  Acute kidney injury.Acute kidney injury occurs when there is sudden damage to the kidneys.  Chronic kidney disease. Chronic kidney disease occurs when the kidneys are damaged over a long period.  End-stage kidney disease. End-stage kidney disease occurs when the kidneys are so damaged that they stop working. In end-stage kidney  disease, the kidneys cannot get better. CAUSES Any condition, disease, or event that damages the kidneys may cause kidney disease. Acute kidney injury.  A problem with blood flow to the kidneys. This may be caused by:   Blood loss.   Heart disease.   Severe burns.   Liver disease.  Direct damage to the kidneys. This may be caused by:  Some medicines.   A kidney infection.   Poisoning or consuming toxic substances.   A surgical wound.   A blow to the kidney area.   A problem with urine flow. This may be caused by:   Cancer.   Kidney stones.   An enlarged prostate. Chronic kidney disease. The most common causes of chronic kidney disease are diabetes and high blood pressure (hypertension). Chronic kidney disease may also be caused by:   Diseases that cause the filtering units of the kidneys to become inflamed.   Diseases that affect the immune system.   Genetic diseases.   Medicines that damage the kidneys, such as anti-inflammatory medicines.  Poisoning or exposure to toxic substances.   A reoccurring kidney or urinary infection.   A problem with urine flow. This may be caused by:  Cancer.   Kidney stones.   An enlarged prostate in males. End-stage kidney disease. This kidney  disease usually occurs when a chronic kidney disease gets worse. It may also occur after acute kidney injury.  SYMPTOMS   Swelling (edema) of the legs, ankles, or feet.   Tiredness (lethargy).   Nausea or vomiting.   Confusion.   Problems with urination, such as:   Painful or burning feeling during urination.   Decreased urine production.  Bloody urine.   Frequent urination, especially at night.  Hypertension.  Muscle twitches and cramps.   Shortness of breath.   Persistent itchiness.   Loss of appetite.  Metallic taste in the mouth.   Weakness.   Seizures.   Chest pain or pressure.   Trouble sleeping.   Headaches.   Abnormally dark or light skin.   Numbness in the hands or feet.   Easy bruising.   Frequent hiccups.   Menstruation stops. Sometimes, no symptoms are present. DIAGNOSIS  Kidney disease may be detected and diagnosed by tests, including blood, urine, imaging, or kidney biopsy tests.  TREATMENT  Acute kidney injury. Treatment of acute kidney injury varies depending on the cause and severity of the kidney damage. In mild cases, no treatment may be needed. The kidneys may heal on their own. If acute kidney injury is more severe, your caregiver will treat the cause of the kidney damage, help the kidneys heal, and prevent complications from occurring. Severe cases may require a procedure to remove toxic wastes from the body (dialysis) or surgery to repair kidney damage. Surgery may involve:   Repair of a torn kidney.   Removal of an obstruction.  Most of the time, you will need to stay overnight at the hospital.  Chronic kidney disease. Most chronic kidney diseases cannot be cured. Treatment usually involves relieving symptoms and preventing or slowing the progression of the disease. Treatment may include:   A special diet. You may need to avoid alcohol and foods that:   Have added salt.   Are high in potassium.    Are high in protein.   Medicines. These may:   Lower blood pressure.   Relieve anemia.   Relieve swelling.   Protect the bones.  End-stage kidney disease. End-stage kidney disease is life-threatening and  must be treated immediately. There are two treatments for end-stage kidney disease:   Dialysis.   Receiving a new kidney (kidney transplant). Both of these treatments have serious risks and consequences. In addition to having dialysis or a kidney transplant, you may need to take medicines to control hypertension and cholesterol and to decrease phosphorus levels in your blood. LENGTH OF ILLNESS  Acute kidney injury.The length of this disease varies greatly from person to person. Exactly how long it lasts depends on the cause of the kidney damage. Acute kidney injury may develop into chronic kidney disease or end-stage kidney disease.  Chronic kidney disease. This disease usually lasts a lifetime. Chronic kidney disease may worsen over time to become end-stage kidney disease. The time it takes for end-stage kidney disease to develop varies from person to person.  End-stage kidney disease. This disease lasts until a kidney transplant is performed. PREVENTION  Kidney disease can sometimes be prevented. If you have diabetes, hypertension, or any other condition that may lead to kidney disease, you should try to prevent kidney disease with:   An appropriate diet.  Medicine.  Lifestyle changes. FOR MORE INFORMATION  American Association of Kidney Patients: ResidentialShow.is  National Kidney Foundation: www.kidney.org  American Kidney Fund: FightingMatch.com.ee  Life Options Rehabilitation Program: www.lifeoptions.org and www.kidneyschool.org  Document Released: 01/10/2005 Document Revised: 12/28/2011 Document Reviewed: 09/09/2011 [facility] Patient Information 2014 [place], [place].

## 2012-06-29 NOTE — Progress Notes (Signed)
  Subjective:    Patient ID: Kristie Anderson, female    DOB: 14-Feb-1945, 67 y.o.   MRN: [id number]  HPI  [name] is here today to go over her most recent lab results and to discuss the conditions listed below.   1)  Hyperlipidemia:  She continues to take pravastatin, fenofibrate and Zetia.  She is hoping that she can come off of some of her medications.    2)  Hypertension:  Her blood pressure is well controlled with her Irbesartan.    3)  GERD:  Her reflux is controlled on Protonix.     Review of Systems  Constitutional: Negative for activity change, fatigue and unexpected weight change.  HENT: Negative.   Eyes: Negative.   Respiratory: Negative for shortness of breath.   Cardiovascular: Negative for chest pain, palpitations and leg swelling.  Gastrointestinal: Negative for diarrhea and constipation.  Endocrine: Negative.   Genitourinary: Negative for difficulty urinating.  Musculoskeletal: Negative.   Skin: Negative.   Neurological: Negative.   Hematological: Negative for adenopathy. Does not bruise/bleed easily.  Psychiatric/Behavioral: Negative for sleep disturbance and dysphoric mood. The patient is not nervous/anxious.     Past Medical History  Diagnosis Date  . Hypertension   . Hypercholesterolemia   . Incontinence   . GERD (gastroesophageal reflux disease)   . Arthritis   . Stroke    Family History  Problem Relation Age of Onset  . Heart disease Mother   . Cancer Mother     breast  . Heart disease Father   . Cancer Daughter     breast   History   Social History Narrative   Marital Status: Married Magazine features editor)   Children: Daughters (Twins)    Pets: Dog (Precious)   Living Situation: Lives with husband and Precious.     Occupation: Housewife   EducationManufacturing engineer   Tobacco Use/Exposure:  None    Alcohol Use:  None   Drug Use:  None   Diet:  Regular   Exercise:  Regular   Hobbies: Reading, Bowling      Objective:   Physical Exam   Constitutional: She appears well-nourished. No distress.  HENT:  Head: Normocephalic.  Eyes: No scleral icterus.  Neck: No thyromegaly present.  Cardiovascular: Normal rate, regular rhythm and normal heart sounds.   Pulmonary/Chest: Effort normal and breath sounds normal.  Abdominal: There is no tenderness.  Musculoskeletal: She exhibits no edema and no tenderness.  Neurological: She is alert.  Skin: Skin is warm and dry.  Psychiatric: She has a normal mood and affect. Her behavior is normal. Judgment and thought content normal.      Assessment & Plan:   TIME 30 MINUTES:  MORE THAN 50 % OF TIME WAS INVOLVED IN COUNSELING.

## 2012-07-01 DIAGNOSIS — K219 Gastro-esophageal reflux disease without esophagitis: Secondary | ICD-10-CM | POA: Insufficient documentation

## 2012-07-01 DIAGNOSIS — E785 Hyperlipidemia, unspecified: Secondary | ICD-10-CM | POA: Insufficient documentation

## 2012-07-01 DIAGNOSIS — I10 Essential (primary) hypertension: Secondary | ICD-10-CM

## 2012-07-01 HISTORY — DX: Essential (primary) hypertension: I10

## 2012-07-01 NOTE — Assessment & Plan Note (Signed)
Her lipid panel is perfect on her combination of pravastatin, Zetia and fenofibrate.  She is going to to continue on the pravastatin daily and will alternate the Zetia and fenofibrate.

## 2012-07-01 NOTE — Assessment & Plan Note (Signed)
Refilled her Protonix.   

## 2012-07-01 NOTE — Assessment & Plan Note (Signed)
Her BP is well controlled on irbesartan. She was given a refill for it.

## 2012-07-19 ENCOUNTER — Telehealth: Payer: Self-pay

## 2012-07-19 NOTE — Telephone Encounter (Signed)
The patient's daughter called due to Kristie Anderson passing clots when she went to use the restroom. I asked if she had been having any problems prior to this situation and was told no. I instructed the patients daughter to get the her to the ER asap. She verbalized understanding at this time. LB

## 2012-11-22 ENCOUNTER — Encounter: Payer: Self-pay | Admitting: Family Medicine

## 2012-11-22 ENCOUNTER — Ambulatory Visit (INDEPENDENT_AMBULATORY_CARE_PROVIDER_SITE_OTHER): Payer: 59 | Admitting: Family Medicine

## 2012-11-22 VITALS — BP 126/78 | HR 74 | Resp 16 | Ht 62.0 in | Wt 167.0 lb

## 2012-11-22 DIAGNOSIS — K219 Gastro-esophageal reflux disease without esophagitis: Secondary | ICD-10-CM

## 2012-11-22 DIAGNOSIS — Z23 Encounter for immunization: Secondary | ICD-10-CM

## 2012-11-22 DIAGNOSIS — I10 Essential (primary) hypertension: Secondary | ICD-10-CM

## 2012-11-22 DIAGNOSIS — E785 Hyperlipidemia, unspecified: Secondary | ICD-10-CM

## 2012-11-22 DIAGNOSIS — D649 Anemia, unspecified: Secondary | ICD-10-CM

## 2012-11-22 MED ORDER — POLYSACCHARIDE IRON COMPLEX 150 MG PO CAPS: 150.0000 mg | ORAL_CAPSULE | Freq: Two times a day (BID) | ORAL | Status: DC

## 2012-11-22 MED ORDER — RANITIDINE HCL 150 MG PO TABS: 150.0000 mg | ORAL_TABLET | Freq: Two times a day (BID) | ORAL | Status: DC

## 2012-11-22 MED ORDER — TETANUS-DIPHTH-ACELL PERTUSSIS 5-2.5-18.5 LF-MCG/0.5 IM SUSP: 0.5000 mL | Freq: Once | INTRAMUSCULAR | Status: DC

## 2012-11-22 MED ORDER — IRBESARTAN 150 MG PO TABS: 150.0000 mg | ORAL_TABLET | Freq: Every day | ORAL | Status: DC

## 2012-11-22 NOTE — Patient Instructions (Signed)
1)  Stomach - Try the Zantac (ranitidine) 150 mg 1-2 times per day with some Tums/Rolaids/Mylanta as needed.   2)  Cholesterol - Hold the Zetia and keep the Fenofibric Acid every other day and the pravastatin 40 mg daily (1/2 of the 80).  Have Dr. Algis Downs. Check a lipid panel and a CMET to check your liver enzymes as well as the kidney.      Fat and Cholesterol Control Diet Cholesterol levels in your body are determined significantly by your diet. Cholesterol levels may also be related to heart disease. The following material helps to explain this relationship and discusses what you can do to help keep your heart healthy. Not all cholesterol is bad. Low-density lipoprotein (LDL) cholesterol is the "bad" cholesterol. It may cause fatty deposits to build up inside your arteries. High-density lipoprotein (HDL) cholesterol is "good." It helps to remove the "bad" LDL cholesterol from your blood. Cholesterol is a very important risk factor for heart disease. Other risk factors are high blood pressure, smoking, stress, heredity, and weight. The heart muscle gets its supply of blood through the coronary arteries. If your LDL cholesterol is high and your HDL cholesterol is low, you are at risk for having fatty deposits build up in your coronary arteries. This leaves less room through which blood can flow. Without sufficient blood and oxygen, the heart muscle cannot function properly and you may feel chest pains (angina pectoris). When a coronary artery closes up entirely, a part of the heart muscle may die causing a heart attack (myocardial infarction). CHECKING CHOLESTEROL When your caregiver sends your blood to a lab to be examined for cholesterol, a complete lipid (fat) profile may be done. With this test, the total amount of cholesterol and levels of LDL and HDL are determined. Triglycerides are a type of fat that circulates in the blood. They can also be used to determine heart disease risk. The list below describes  what the numbers should be: Test: Total Cholesterol.  Less than 200 mg/dl. Test: LDL "bad cholesterol."  Less than 100 mg/dl.  Less than 70 mg/dl if you are at very high risk of a heart attack or sudden cardiac death. Test: HDL "good cholesterol."  Greater than 50 mg/dl for women.  Greater than 40 mg/dl for men. Test: Triglycerides.  Less than 150 mg/dl. CONTROLLING CHOLESTEROL WITH DIET Although exercise and lifestyle factors are important, your diet is key. That is because certain foods are known to raise cholesterol and others to lower it. The goal is to balance foods for their effect on cholesterol and more importantly, to replace saturated and trans fat with other types of fat, such as monounsaturated fat, polyunsaturated fat, and omega-3 fatty acids. On average, a person should consume no more than 15 to 17 g of saturated fat daily. Saturated and trans fats are considered "bad" fats, and they will raise LDL cholesterol. Saturated fats are primarily found in animal products such as meats, butter, and cream. However, that does not mean you need to give up all your favorite foods. Today, there are good tasting, low-fat, low-cholesterol substitutes for most of the things you like to eat. Choose low-fat or nonfat alternatives. Choose round or loin cuts of red meat. These types of cuts are lowest in fat and cholesterol. Chicken (without the skin), fish, veal, and ground [place] breast are great choices. Eliminate fatty meats, such as hot dogs and salami. Even shellfish have little or no saturated fat. Have a 3 oz (85 g)  portion when you eat lean meat, poultry, or fish. Trans fats are also called "partially hydrogenated oils." They are oils that have been scientifically manipulated so that they are solid at room temperature resulting in a longer shelf life and improved taste and texture of foods in which they are added. Trans fats are found in stick margarine, some tub margarines, cookies,  crackers, and baked goods.  When baking and cooking, oils are a great substitute for butter. The monounsaturated oils are especially beneficial since it is believed they lower LDL and raise HDL. The oils you should avoid entirely are saturated tropical oils, such as coconut and palm.  Remember to eat a lot from food groups that are naturally free of saturated and trans fat, including fish, fruit, vegetables, beans, grains (barley, rice, couscous, bulgur wheat), and pasta (without cream sauces).  IDENTIFYING FOODS THAT LOWER CHOLESTEROL  Soluble fiber may lower your cholesterol. This type of fiber is found in fruits such as apples, vegetables such as broccoli, potatoes, and carrots, legumes such as beans, peas, and lentils, and grains such as barley. Foods fortified with plant sterols (phytosterol) may also lower cholesterol. You should eat at least 2 g per day of these foods for a cholesterol lowering effect.  Read package labels to identify low-saturated fats, trans fat free, and low-fat foods at the supermarket. Select cheeses that have only 2 to 3 g saturated fat per ounce. Use a heart-healthy tub margarine that is free of trans fats or partially hydrogenated oil. When buying baked goods (cookies, crackers), avoid partially hydrogenated oils. Breads and muffins should be made from whole grains (whole-wheat or whole oat flour, instead of "flour" or "enriched flour"). Buy non-creamy canned soups with reduced salt and no added fats.  FOOD PREPARATION TECHNIQUES  Never deep-fry. If you must fry, either stir-fry, which uses very little fat, or use non-stick cooking sprays. When possible, broil, bake, or roast meats, and steam vegetables. Instead of putting butter or margarine on vegetables, use lemon and herbs, applesauce, and cinnamon (for squash and sweet potatoes), nonfat yogurt, salsa, and low-fat dressings for salads.  LOW-SATURATED FAT / LOW-FAT FOOD SUBSTITUTES Meats / Saturated Fat (g)  Avoid:  Steak, marbled (3 oz/85 g) / 11 g  Choose: Steak, lean (3 oz/85 g) / 4 g  Avoid: Hamburger (3 oz/85 g) / 7 g  Choose: Hamburger, lean (3 oz/85 g) / 5 g  Avoid: Ham (3 oz/85 g) / 6 g  Choose: Ham, lean cut (3 oz/85 g) / 2.4 g  Avoid: Chicken, with skin, dark meat (3 oz/85 g) / 4 g  Choose: Chicken, skin removed, dark meat (3 oz/85 g) / 2 g  Avoid: Chicken, with skin, light meat (3 oz/85 g) / 2.5 g  Choose: Chicken, skin removed, light meat (3 oz/85 g) / 1 g Dairy / Saturated Fat (g)  Avoid: Whole milk (1 cup) / 5 g  Choose: Low-fat milk, 2% (1 cup) / 3 g  Choose: Low-fat milk, 1% (1 cup) / 1.5 g  Choose: Skim milk (1 cup) / 0.3 g  Avoid: Hard cheese (1 oz/28 g) / 6 g  Choose: Skim milk cheese (1 oz/28 g) / 2 to 3 g  Avoid: Cottage cheese, 4% fat (1 cup) / 6.5 g  Choose: Low-fat cottage cheese, 1% fat (1 cup) / 1.5 g  Avoid: Ice cream (1 cup) / 9 g  Choose: Sherbet (1 cup) / 2.5 g  Choose: Nonfat frozen yogurt (1 cup) / 0.3  g  Choose: Frozen fruit bar / trace  Avoid: Whipped cream (1 tbs) / 3.5 g  Choose: Nondairy whipped topping (1 tbs) / 1 g Condiments / Saturated Fat (g)  Avoid: Mayonnaise (1 tbs) / 2 g  Choose: Low-fat mayonnaise (1 tbs) / 1 g  Avoid: Butter (1 tbs) / 7 g  Choose: Extra light margarine (1 tbs) / 1 g  Avoid: Coconut oil (1 tbs) / 11.8 g  Choose: Olive oil (1 tbs) / 1.8 g  Choose: Corn oil (1 tbs) / 1.7 g  Choose: Safflower oil (1 tbs) / 1.2 g  Choose: Sunflower oil (1 tbs) / 1.4 g  Choose: Soybean oil (1 tbs) / 2.4 g  Choose: Canola oil (1 tbs) / 1 g Document Released: 01/10/2005 Document Revised: 04/04/2011 Document Reviewed: 07/01/2010 [employer] Patient Information 2014 [place], [place].

## 2012-11-22 NOTE — Progress Notes (Signed)
  Subjective:    Patient ID: Kristie Anderson, female    DOB: 1945-08-05, 67 y.o.   MRN: [id number]  HPI  [name] is here today to discuss the condition listed below:   1)  Hypertension:  She has done well with her ibersatan 150 mg.  She needs a refill on it.   2)  Bladder Incontinence:  Her bladder is doing well with her Oxibutynin (5 mg).  She needs a refill on it.     Review of Systems  Constitutional: Negative.   HENT: Negative.   Eyes: Negative.   Respiratory: Negative.   Cardiovascular: Negative.   Gastrointestinal: Negative.   Endocrine: Negative.   Genitourinary: Negative.   Musculoskeletal: Negative.   Skin: Negative.   Allergic/Immunologic: Negative.   Neurological: Negative.   Hematological: Negative.   Psychiatric/Behavioral: Negative.     Past Medical History  Diagnosis Date  . Hypertension   . Hypercholesterolemia   . Incontinence   . GERD (gastroesophageal reflux disease)   . Arthritis   . Stroke     Family History  Problem Relation Age of Onset  . Heart disease Mother   . Cancer Mother     breast  . Heart disease Father   . Cancer Daughter     breast     History   Social History Narrative   Marital Status: Married Magazine features editor)   Children: Daughters (Twins)    Pets: Dog (Precious)   Living Situation: Lives with husband and Precious.     Occupation: Housewife   EducationManufacturing engineer   Tobacco Use/Exposure:  None    Alcohol Use:  None   Drug Use:  None   Diet:  Regular   Exercise:  Regular   Hobbies: Reading, Bowling      Objective:   Physical Exam  Vitals reviewed. Constitutional: She is oriented to person, place, and time. She appears well-developed and well-nourished.  Eyes: Conjunctivae are normal. No scleral icterus.  Neck: Neck supple. No thyromegaly present.  Cardiovascular: Normal rate, regular rhythm and normal heart sounds.   Pulmonary/Chest: Effort normal and breath sounds normal.  Musculoskeletal: She exhibits no  edema and no tenderness.  Lymphadenopathy:    She has no cervical adenopathy.  Neurological: She is alert and oriented to person, place, and time.  Skin: Skin is warm and dry.  Psychiatric: She has a normal mood and affect. Her behavior is normal. Judgment and thought content normal.     Assessment & Plan:

## 2012-11-25 DIAGNOSIS — D649 Anemia, unspecified: Secondary | ICD-10-CM | POA: Insufficient documentation

## 2012-11-25 DIAGNOSIS — Z23 Encounter for immunization: Secondary | ICD-10-CM

## 2012-11-25 HISTORY — DX: Encounter for immunization: Z23

## 2012-11-25 HISTORY — DX: Anemia, unspecified: D64.9

## 2012-11-25 NOTE — Assessment & Plan Note (Signed)
Refilled her Avapro.

## 2012-11-25 NOTE — Assessment & Plan Note (Signed)
She is currently taking a combination of pravastatin, Zetia and Trilipix.  She is going to hold the Zetia for now.  She'll have Dr. Darrick Penna check a lipid panel.

## 2012-11-25 NOTE — Assessment & Plan Note (Signed)
She was given a prescription for Boostrix.   

## 2012-11-25 NOTE — Assessment & Plan Note (Addendum)
Her GERD symptoms are better since she has been watching her diet more carefully.  She is going to stop the pantoprazole and take ranitidine BID PRN.

## 2012-11-25 NOTE — Assessment & Plan Note (Signed)
She is going to try some Nu-Iron.

## 2012-12-31 ENCOUNTER — Other Ambulatory Visit: Payer: Self-pay | Admitting: Family Medicine

## 2012-12-31 DIAGNOSIS — Z1231 Encounter for screening mammogram for malignant neoplasm of breast: Secondary | ICD-10-CM

## 2013-01-23 ENCOUNTER — Ambulatory Visit (HOSPITAL_COMMUNITY)
Admission: RE | Admit: 2013-01-23 | Discharge: 2013-01-23 | Disposition: A | Discharge status: Home / Self Care | Payer: 59 | Source: Ambulatory Visit | Attending: Family Medicine | Admitting: Family Medicine

## 2013-01-23 DIAGNOSIS — Z1231 Encounter for screening mammogram for malignant neoplasm of breast: Secondary | ICD-10-CM

## 2013-01-29 ENCOUNTER — Encounter: Payer: Self-pay | Admitting: Family Medicine

## 2013-01-29 ENCOUNTER — Ambulatory Visit (HOSPITAL_BASED_OUTPATIENT_CLINIC_OR_DEPARTMENT_OTHER)
Admission: RE | Admit: 2013-01-29 | Discharge: 2013-01-29 | Disposition: A | Discharge status: Home / Self Care | Payer: Medicare Other | Source: Ambulatory Visit | Attending: Family Medicine | Admitting: Family Medicine

## 2013-01-29 ENCOUNTER — Ambulatory Visit (INDEPENDENT_AMBULATORY_CARE_PROVIDER_SITE_OTHER): Payer: Medicare Other | Admitting: Family Medicine

## 2013-01-29 VITALS — BP 108/72 | HR 97 | Temp 97.6°F | Resp 16 | Wt 162.0 lb

## 2013-01-29 DIAGNOSIS — R059 Cough, unspecified: Secondary | ICD-10-CM

## 2013-01-29 DIAGNOSIS — R51 Headache: Secondary | ICD-10-CM

## 2013-01-29 DIAGNOSIS — R05 Cough: Secondary | ICD-10-CM

## 2013-01-29 DIAGNOSIS — J069 Acute upper respiratory infection, unspecified: Secondary | ICD-10-CM

## 2013-01-29 DIAGNOSIS — K449 Diaphragmatic hernia without obstruction or gangrene: Secondary | ICD-10-CM | POA: Insufficient documentation

## 2013-01-29 DIAGNOSIS — G2581 Restless legs syndrome: Secondary | ICD-10-CM

## 2013-01-29 MED ORDER — AZITHROMYCIN 500 MG PO TABS: 500.0000 mg | ORAL_TABLET | Freq: Every day | ORAL | Status: AC

## 2013-01-29 MED ORDER — ROPINIROLE HCL 2 MG PO TABS: 2.0000 mg | ORAL_TABLET | Freq: Every day | ORAL | Status: DC

## 2013-01-29 MED ORDER — BENZONATATE 200 MG PO CAPS: 200.0000 mg | ORAL_CAPSULE | Freq: Three times a day (TID) | ORAL | Status: DC | PRN

## 2013-01-29 MED ORDER — HYDROCODONE-HOMATROPINE 5-1.5 MG/5ML PO SYRP: 5.0000 mL | ORAL_SOLUTION | Freq: Four times a day (QID) | ORAL | Status: DC | PRN

## 2013-01-29 NOTE — Patient Instructions (Addendum)
1)  Head Congestion - Milta Deiters Med Sinus Rinse (Distilled Water + Blue Packets)  + Zyrtec D 5/120 1 pills twice a day  2)  Chest Congestion/Cough - Mucinex DM (Max Strength) 1200/60 1 pill 2 x per day + Tessalon Perles 200 mg 3 x per day plus Hycodan Syrup 1-2  Tsp every 6 hours for cough.  Umcka Cold Care 2 droppers 3 x per day.    3)  If you are worsening at the end of 2 weeks then you can take the Zithromax 500 mg per day for 3 days.    4)  Restless Leg Syndrome - Requip - Take 1/4 tab up to 3 hours before bedtime for 4 days then go to 1/2 tab for 7 days then up to 1 tab if needed.     Restless Legs Syndrome Restless legs syndrome is a movement disorder. It may also be called a sensori-motor disorder.  CAUSES  No one knows what specifically causes restless legs syndrome, but it tends to run in families. It is also more common in people with low iron, in pregnancy, in people who need dialysis, and those with nerve damage (neuropathy).Some medications may make restless legs syndrome worse.Those medications include drugs to treat high blood pressure, some heart conditions, nausea, colds, allergies, and depression. SYMPTOMS Symptoms include uncomfortable sensations in the legs. These leg sensations are worse during periods of inactivity or rest. They are also worse while sitting or lying down. Individuals that have the disorder describe sensations in the legs that feel like:  Pulling.  Drawing.  Crawling.  Worming.  Boring.  Tingling.  Pins and needles.  Prickling.  Pain. The sensations are usually accompanied by an overwhelming urge to move the legs. Sudden muscle jerks may also occur. Movement provides temporary relief from the discomfort. In rare cases, the arms may also be affected. Symptoms may interfere with going to sleep (sleep onset insomnia). Restless legs syndrome may also be related to periodic limb movement disorder (PLMD). PLMD is another more common motor disorder. It  also causes interrupted sleep. The symptoms from PLMD usually occur most often when you are awake. TREATMENT  Treatment for restless legs syndrome is symptomatic. This means that the symptoms are treated.   Massage and cold compresses may provide temporary relief.  Walk, stretch, or take a cold or hot bath.  Get regular exercise and a good night's sleep.  Avoid caffeine, alcohol, nicotine, and medications that can make it worse.  Do activities that provide mental stimulation like discussions, needlework, and video games. These may be helpful if you are not able to walk or stretch. Some medications are effective in relieving the symptoms. However, many of these medications have side effects. Ask your caregiver about medications that may help your symptoms. Correcting iron deficiency may improve symptoms for some patients. Document Released: 12/31/2001 Document Revised: 04/04/2011 Document Reviewed: 04/08/2010 [facility] Patient Information 2014 [place].

## 2013-01-29 NOTE — Progress Notes (Signed)
Subjective:    Patient ID: Kristie Anderson, female    DOB: 1945/10/10, 68 y.o.   MRN: [id number]  HPI  Kristie Anderson is here today complaining if severe cold symptoms including a cough, drainage, headache, fatigue and ear pain. She has been sick for about 5 days. She has taken OTC Muchinex. She feels that her symptoms are getting worse. She has also been bothered by restless leg symptoms.     Review of Systems  Constitutional: Positive for fatigue. Negative for fever.  HENT: Positive for congestion, ear pain and rhinorrhea.   Respiratory: Positive for cough and shortness of breath.   Neurological: Positive for headaches.     Past Medical History  Diagnosis Date  . Hypertension   . Hypercholesterolemia   . Incontinence   . GERD (gastroesophageal reflux disease)   . Arthritis   . Stroke      Past Surgical History  Procedure Laterality Date  . Fundoplication  6213    total  . Cholecystectomy    . Abdominal hysterectomy       History   Social History Narrative   Marital Status: Married Dentist)   Children: Daughters (Twins)    Pets: Dog (Precious)   Living Situation: Lives with husband and Precious.     Occupation: Housewife   EducationForensic psychologist   Tobacco Use/Exposure:  None    Alcohol Use:  None   Drug Use:  None   Diet:  Regular   Exercise:  Regular   Hobbies: Reading, Bowling     Family History  Problem Relation Age of Onset  . Heart disease Mother   . Cancer Mother     breast  . Heart disease Father   . Cancer Daughter     breast     Current Outpatient Prescriptions on File Prior to Visit  Medication Sig Dispense Refill  . aspirin 81 MG tablet Take 81 mg by mouth daily.      . Cholecalciferol (VITAMIN D PO) Take by mouth daily.      . Choline Fenofibrate (TRILIPIX) 135 MG capsule Take 1 capsule (135 mg total) by mouth daily.  30 capsule  11  . irbesartan (AVAPRO) 150 MG tablet Take 1 tablet (150 mg total) by mouth daily.  30 tablet  11  .  oxybutynin (DITROPAN-XL) 10 MG 24 hr tablet Take 10 mg by mouth daily.        . pravastatin (PRAVACHOL) 80 MG tablet Take 1 tablet (80 mg total) by mouth at bedtime.  30 tablet  11   No current facility-administered medications on file prior to visit.     No Known Allergies   Immunization History  Administered Date(s) Administered  . Influenza Whole 10/23/2012  . Pneumococcal Conjugate-13 10/23/2012  . Zoster 06/23/2009      Objective:   Physical Exam  Nursing note and vitals reviewed. Constitutional: She appears well-developed and well-nourished.  Neck: Normal range of motion. Neck supple.  Cardiovascular: Normal rate.   Pulmonary/Chest: She has wheezes.  Abdominal: Soft. Bowel sounds are normal.  Musculoskeletal: Normal range of motion.  Neurological: She is alert.  Skin: Skin is warm and dry.  Psychiatric: She has a normal mood and affect. Her behavior is normal. Judgment and thought content normal.      Assessment & Plan:    Kristie Anderson was seen today for cough.  Diagnoses and associated orders for this visit:  Cough - DG Chest 2 View; - (No acute process)    -  benzonatate (TESSALON) 200 MG capsule; Take 1 capsule (200 mg total) by mouth 3 (three) times daily as needed for cough. - HYDROcodone-homatropine (HYCODAN) 5-1.5 MG/5ML syrup; Take 5 mLs by mouth every 6 (six) hours as needed for cough. - azithromycin (ZITHROMAX) 500 MG tablet; Take 1 tablet (500 mg total) by mouth daily. Take 1 tablet daily for 3 days.  Restless leg syndrome - rOPINIRole (REQUIP) 2 MG tablet; Take 1 tablet (2 mg total) by mouth at bedtime.

## 2013-01-30 ENCOUNTER — Ambulatory Visit: Payer: 59 | Admitting: Family Medicine

## 2013-02-03 DIAGNOSIS — G2581 Restless legs syndrome: Secondary | ICD-10-CM

## 2013-02-03 DIAGNOSIS — R05 Cough: Secondary | ICD-10-CM | POA: Insufficient documentation

## 2013-02-03 DIAGNOSIS — R059 Cough, unspecified: Secondary | ICD-10-CM

## 2013-02-03 HISTORY — DX: Cough, unspecified: R05.9

## 2013-02-03 HISTORY — DX: Restless legs syndrome: G25.81

## 2013-05-17 ENCOUNTER — Other Ambulatory Visit: Payer: Self-pay | Admitting: *Deleted

## 2013-05-17 DIAGNOSIS — R5381 Other malaise: Secondary | ICD-10-CM

## 2013-05-17 DIAGNOSIS — E785 Hyperlipidemia, unspecified: Secondary | ICD-10-CM

## 2013-05-17 DIAGNOSIS — R5383 Other fatigue: Principal | ICD-10-CM

## 2013-05-20 ENCOUNTER — Other Ambulatory Visit: Payer: Medicare Other

## 2013-05-21 LAB — CBC WITH DIFFERENTIAL/PLATELET
Basophils Absolute: 0.1 10*3/uL (ref 0.0–0.1)
Basophils Relative: 1 % (ref 0–1)
Eosinophils Absolute: 0.3 10*3/uL (ref 0.0–0.7)
Eosinophils Relative: 4 % (ref 0–5)
HCT: 42.2 % (ref 36.0–46.0)
Hemoglobin: 13.8 g/dL (ref 12.0–15.0)
Lymphocytes Relative: 22 % (ref 12–46)
Lymphs Abs: 1.7 10*3/uL (ref 0.7–4.0)
MCH: 30.6 pg (ref 26.0–34.0)
MCHC: 32.7 g/dL (ref 30.0–36.0)
MCV: 93.6 fL (ref 78.0–100.0)
Monocytes Absolute: 0.7 10*3/uL (ref 0.1–1.0)
Monocytes Relative: 9 % (ref 3–12)
Neutro Abs: 5 10*3/uL (ref 1.7–7.7)
Neutrophils Relative %: 64 % (ref 43–77)
Platelets: 338 10*3/uL (ref 150–400)
RBC: 4.51 MIL/uL (ref 3.87–5.11)
RDW: 13.9 % (ref 11.5–15.5)
WBC: 7.8 10*3/uL (ref 4.0–10.5)

## 2013-05-21 LAB — COMPLETE METABOLIC PANEL WITH GFR
ALT: 10 U/L (ref 0–35)
AST: 18 U/L (ref 0–37)
Albumin: 4.1 g/dL (ref 3.5–5.2)
Alkaline Phosphatase: 54 U/L (ref 39–117)
BUN: 23 mg/dL (ref 6–23)
CO2: 29 mEq/L (ref 19–32)
Calcium: 10.1 mg/dL (ref 8.4–10.5)
Chloride: 103 mEq/L (ref 96–112)
Creat: 1.23 mg/dL — ABNORMAL HIGH (ref 0.50–1.10)
GFR, Est African American: 53 mL/min — ABNORMAL LOW
GFR, Est Non African American: 46 mL/min — ABNORMAL LOW
Glucose, Bld: 84 mg/dL (ref 70–99)
Potassium: 4.7 mEq/L (ref 3.5–5.3)
Sodium: 140 mEq/L (ref 135–145)
Total Bilirubin: 0.4 mg/dL (ref 0.2–1.2)
Total Protein: 7 g/dL (ref 6.0–8.3)

## 2013-05-21 LAB — LIPID PANEL
Cholesterol: 169 mg/dL (ref 0–200)
HDL: 61 mg/dL (ref >39)
LDL Cholesterol: 91 mg/dL (ref 0–99)
Total CHOL/HDL Ratio: 2.8 Ratio
Triglycerides: 83 mg/dL (ref <150)
VLDL: 17 mg/dL (ref 0–40)

## 2013-05-21 LAB — TSH: TSH: 2.81 u[IU]/mL (ref 0.350–4.500)

## 2013-05-31 ENCOUNTER — Encounter: Payer: Self-pay | Admitting: Family Medicine

## 2013-05-31 ENCOUNTER — Ambulatory Visit (INDEPENDENT_AMBULATORY_CARE_PROVIDER_SITE_OTHER): Payer: Medicare Other | Admitting: Family Medicine

## 2013-05-31 VITALS — BP 111/63 | HR 75 | Resp 16 | Ht 62.5 in | Wt 168.0 lb

## 2013-05-31 DIAGNOSIS — N3941 Urge incontinence: Secondary | ICD-10-CM

## 2013-05-31 DIAGNOSIS — N951 Menopausal and female climacteric states: Secondary | ICD-10-CM

## 2013-05-31 DIAGNOSIS — E785 Hyperlipidemia, unspecified: Secondary | ICD-10-CM

## 2013-05-31 DIAGNOSIS — I10 Essential (primary) hypertension: Secondary | ICD-10-CM

## 2013-05-31 DIAGNOSIS — Z780 Asymptomatic menopausal state: Secondary | ICD-10-CM

## 2013-05-31 MED ORDER — PRAVASTATIN SODIUM 80 MG PO TABS: 80.0000 mg | ORAL_TABLET | Freq: Every day | ORAL | Status: DC

## 2013-05-31 MED ORDER — IRBESARTAN 150 MG PO TABS: 150.0000 mg | ORAL_TABLET | Freq: Every day | ORAL | Status: DC

## 2013-05-31 MED ORDER — CHOLINE FENOFIBRATE 135 MG PO CPDR: 135.0000 mg | DELAYED_RELEASE_CAPSULE | Freq: Every day | ORAL | Status: DC

## 2013-05-31 MED ORDER — OXYBUTYNIN CHLORIDE ER 10 MG PO TB24: 10.0000 mg | ORAL_TABLET | Freq: Two times a day (BID) | ORAL | Status: DC

## 2013-05-31 NOTE — Progress Notes (Signed)
Subjective:    Patient ID: Kristie Anderson, female    DOB: 1946-01-22, 68 y.o.   MRN: [id number]  HPI  Kristie Anderson is here today to discuss her most recent labs and  the condition listed below:   1)  Hypertension:  Her BP is controlled on Avapro.   2)  Bladder Incontinence: Her overactive bladder symptoms are controlled on oxybutynin (5 mg). She needs a refill on it.   3)  Hyperlipidemia - She is currently taking Pravastatin and Trilipix.  She is doing well and needs refills.  She has stopped the Zetia as directed.    Review of Systems  Constitutional: Negative for activity change, appetite change and unexpected weight change.  Cardiovascular: Negative for chest pain, palpitations and leg swelling.  Genitourinary: Negative for difficulty urinating.  All other systems reviewed and are negative.    Past Medical History  Diagnosis Date  . Hypertension   . Hypercholesterolemia   . Incontinence   . GERD (gastroesophageal reflux disease)   . Arthritis   . Stroke      Past Surgical History  Procedure Laterality Date  . Fundoplication  5284    total  . Cholecystectomy    . Abdominal hysterectomy       History   Social History Narrative   Marital Status: Married Dentist)   Children: Daughters (Twins)    Pets: Dog (Precious)   Living Situation: Lives with husband and Precious.     Occupation: Housewife   EducationForensic psychologist   Tobacco Use/Exposure:  None    Alcohol Use:  None   Drug Use:  None   Diet:  Regular   Exercise:  Regular   Hobbies: Reading, Bowling     Family History  Problem Relation Age of Onset  . Heart disease Mother   . Cancer Mother     breast  . Heart disease Father   . Cancer Daughter     breast     Current Outpatient Prescriptions on File Prior to Visit  Medication Sig Dispense Refill  . aspirin 81 MG tablet Take 81 mg by mouth daily.      . Cholecalciferol (VITAMIN D PO) Take by mouth daily.      Marland Kitchen rOPINIRole (REQUIP) 2 MG  tablet Take 1 tablet (2 mg total) by mouth at bedtime.  30 tablet  11   No current facility-administered medications on file prior to visit.     No Known Allergies   Immunization History  Administered Date(s) Administered  . Influenza Whole 10/23/2012  . Pneumococcal Conjugate-13 10/23/2012  . Zoster 06/23/2009       Objective:   Physical Exam  Vitals reviewed. Constitutional: She is oriented to person, place, and time. She appears well-developed and well-nourished.  Eyes: Conjunctivae are normal. No scleral icterus.  Neck: Neck supple. No thyromegaly present.  Cardiovascular: Normal rate, regular rhythm and normal heart sounds.   Pulmonary/Chest: Effort normal and breath sounds normal.  Musculoskeletal: She exhibits no edema and no tenderness.  Lymphadenopathy:    She has no cervical adenopathy.  Neurological: She is alert and oriented to person, place, and time.  Skin: Skin is warm and dry.  Psychiatric: She has a normal mood and affect. Her behavior is normal. Judgment and thought content normal.      Assessment & Plan:   Jennet was seen today for medication refill.  Diagnoses and associated orders for this visit:  Essential hypertension, benign - irbesartan (AVAPRO) 150 MG tablet;  Take 1 tablet (150 mg total) by mouth daily.  Other and unspecified hyperlipidemia - pravastatin (PRAVACHOL) 80 MG tablet; Take 1 tablet (80 mg total) by mouth at bedtime. - Choline Fenofibrate (TRILIPIX) 135 MG capsule; Take 1 capsule (135 mg total) by mouth daily.  Urinary incontinence - oxybutynin (DITROPAN) 5 MG tablet; Take 1 tablet (5 mg total) by mouth 3 (three) times daily.  Menopause - DG Bone Density; Future   TIME SPENT "FACE TO FACE" WITH PATIENT -  30 MINS

## 2013-05-31 NOTE — Patient Instructions (Signed)
  Calcium Intake Recommendations Calcium in our blood is important for the control of many things, such as:  Blood clotting.  Conducting of nerve impulses.  Muscle contraction.  Maintaining teeth and bone health.  Other body functions. Age group / Amount of calcium to consume daily, in milligrams (mg)  Birth to 6 months / 200 mg  Infants 7 to 12 months / 260 mg  Children 1 to 3 years / 700 mg  Children 4 to 8 years / 1,000 mg  Children 9 to 13 years / 1,300 mg  Teens 14 to 18 years / 1,300 mg  Adults 19 to 50 years / 1,000 mg  Adult women 51 to 70 years / 1,200 mg  Adults 71 years and older / 1,200 mg  Pregnant and breastfeeding teens / 1,300 mg  Pregnant and breastfeeding adults / 1,000 mg Document Released: 08/25/2003 Document Revised: 05/07/2012 Document Reviewed: 01/10/2005 [employer] Patient Information 2014 [employer].   

## 2013-06-12 ENCOUNTER — Telehealth: Payer: Self-pay

## 2013-06-12 NOTE — Telephone Encounter (Signed)
Kristie Anderson called and said she got the wrong Oxybutynin filled. She does not want the ER she wants the 5mg  tab because it works better for her. Could you call this one in for her?

## 2013-06-18 MED ORDER — OXYBUTYNIN CHLORIDE 5 MG PO TABS: 5.0000 mg | ORAL_TABLET | Freq: Three times a day (TID) | ORAL | Status: DC

## 2013-07-16 ENCOUNTER — Encounter: Payer: Self-pay | Admitting: Family Medicine

## 2013-07-16 ENCOUNTER — Ambulatory Visit (INDEPENDENT_AMBULATORY_CARE_PROVIDER_SITE_OTHER): Payer: Medicare Other | Admitting: Family Medicine

## 2013-07-16 VITALS — BP 121/76 | HR 97 | Resp 16 | Ht 62.0 in | Wt 162.0 lb

## 2013-07-16 DIAGNOSIS — J069 Acute upper respiratory infection, unspecified: Secondary | ICD-10-CM

## 2013-07-16 MED ORDER — HYDROCODONE-HOMATROPINE 5-1.5 MG/5ML PO SYRP: 5.0000 mL | ORAL_SOLUTION | Freq: Four times a day (QID) | ORAL | Status: DC | PRN

## 2013-07-16 MED ORDER — METHYLPREDNISOLONE SODIUM SUCC 125 MG IJ SOLR
125.0000 mg | Freq: Once | INTRAMUSCULAR | Status: AC
  Administered 2013-07-16: 125 mg via INTRAMUSCULAR

## 2013-07-16 NOTE — Progress Notes (Signed)
Subjective:    Patient ID: Kristie Anderson, female    DOB: 03/02/1945, 68 y.o.   MRN: [id number]  HPI  Kristie Anderson is here today complaining of allergy symptoms including drainage and congestion. She says the drainage is so bad that it causes her to feel very nauseated. She has taken OTC pseudoephedrine and sinus rinse for her symptoms but they have not helped. She feels that she is not getting better.    Review of Systems  Constitutional: Negative for activity change, appetite change and fatigue.  HENT: Positive for congestion, postnasal drip and sore throat.        Drainage  Eyes: Negative for discharge and itching.  Respiratory: Positive for cough.   Cardiovascular: Negative for chest pain, palpitations and leg swelling.  All other systems reviewed and are negative.    Past Medical History  Diagnosis Date  . Hypertension   . Hypercholesterolemia   . Incontinence   . GERD (gastroesophageal reflux disease)   . Arthritis   . Stroke      Past Surgical History  Procedure Laterality Date  . Fundoplication  1751    total  . Cholecystectomy    . Abdominal hysterectomy       History   Social History Narrative   Marital Status: Married Dentist)   Children: Daughters (Twins)    Pets: Dog (Precious)   Living Situation: Lives with husband and Precious.     Occupation: Housewife   EducationForensic psychologist   Tobacco Use/Exposure:  None    Alcohol Use:  None   Drug Use:  None   Diet:  Regular   Exercise:  Regular   Hobbies: Reading, Bowling     Family History  Problem Relation Age of Onset  . Heart disease Mother   . Cancer Mother     breast  . Heart disease Father   . Cancer Daughter     breast     Current Outpatient Prescriptions on File Prior to Visit  Medication Sig Dispense Refill  . aspirin 81 MG tablet Take 81 mg by mouth daily.      . Cholecalciferol (VITAMIN D PO) Take by mouth daily.      . Choline Fenofibrate (TRILIPIX) 135 MG capsule Take 1  capsule (135 mg total) by mouth daily.  30 capsule  11  . irbesartan (AVAPRO) 150 MG tablet Take 1 tablet (150 mg total) by mouth daily.  30 tablet  2  . oxybutynin (DITROPAN) 5 MG tablet Take 1 tablet (5 mg total) by mouth 3 (three) times daily.  270 tablet  3  . pravastatin (PRAVACHOL) 80 MG tablet Take 1 tablet (80 mg total) by mouth at bedtime.  30 tablet  5  . rOPINIRole (REQUIP) 2 MG tablet Take 1 tablet (2 mg total) by mouth at bedtime.  30 tablet  11   No current facility-administered medications on file prior to visit.     No Known Allergies   Immunization History  Administered Date(s) Administered  . Influenza Whole 10/23/2012  . Pneumococcal Conjugate-13 10/23/2012  . Zoster 06/23/2009       Objective:   Physical Exam  Nursing note and vitals reviewed. Constitutional: She appears well-developed and well-nourished.  Neck: Normal range of motion. Neck supple.  Cardiovascular: Normal rate.   Pulmonary/Chest: She has no wheezes.  Abdominal: Soft. Bowel sounds are normal.  Musculoskeletal: Normal range of motion.  Neurological: She is alert.  Skin: Skin is warm and dry.  Psychiatric:  She has a normal mood and affect. Her behavior is normal. Judgment and thought content normal.      Assessment & Plan:    Kristie Anderson was seen today for allergies.  Diagnoses and associated orders for this visit:  Acute upper respiratory infections of unspecified site - HYDROcodone-homatropine (HYCODAN) 5-1.5 MG/5ML syrup; Take 5 mLs by mouth every 6 (six) hours as needed. - methylPREDNISolone sodium succinate (SOLU-MEDROL) 125 mg/2 mL injection 125 mg; Inject 2 mLs (125 mg total) into the muscle once.

## 2013-07-16 NOTE — Patient Instructions (Signed)
1)  URI - Take 1200 mg of plain Mucinex twice a day with A LOT of water.  Add the [facility] 2 droppers 3-4 x per day and take the cough syrup at bedtime.  If you need something else, you can try the Nasonex nasal spray (2 sprays in each nostril at bedtime).      Upper Respiratory Infection, Adult An upper respiratory infection (URI) is also sometimes known as the common cold. The upper respiratory tract includes the nose, sinuses, throat, trachea, and bronchi. Bronchi are the airways leading to the lungs. Most people improve within 1 week, but symptoms can last up to 2 weeks. A residual cough may last even longer.  CAUSES Many different viruses can infect the tissues lining the upper respiratory tract. The tissues become irritated and inflamed and often become very moist. Mucus production is also common. A cold is contagious. You can easily spread the virus to others by oral contact. This includes kissing, sharing a glass, coughing, or sneezing. Touching your mouth or nose and then touching a surface, which is then touched by another person, can also spread the virus. SYMPTOMS  Symptoms typically develop 1 to 3 days after you come in contact with a cold virus. Symptoms vary from person to person. They may include:  Runny nose.  Sneezing.  Nasal congestion.  Sinus irritation.  Sore throat.  Loss of voice (laryngitis).  Cough.  Fatigue.  Muscle aches.  Loss of appetite.  Headache.  Low-grade fever. DIAGNOSIS  You might diagnose your own cold based on familiar symptoms, since most people get a cold 2 to 3 times a year. Your caregiver can confirm this based on your exam. Most importantly, your caregiver can check that your symptoms are not due to another disease such as strep throat, sinusitis, pneumonia, asthma, or epiglottitis. Blood tests, throat tests, and X-rays are not necessary to diagnose a common cold, but they may sometimes be helpful in excluding other more serious  diseases. Your caregiver will decide if any further tests are required. RISKS AND COMPLICATIONS  You may be at risk for a more severe case of the common cold if you smoke cigarettes, have chronic heart disease (such as heart failure) or lung disease (such as asthma), or if you have a weakened immune system. The very young and very old are also at risk for more serious infections. Bacterial sinusitis, middle ear infections, and bacterial pneumonia can complicate the common cold. The common cold can worsen asthma and chronic obstructive pulmonary disease (COPD). Sometimes, these complications can require emergency medical care and may be life-threatening. PREVENTION  The best way to protect against getting a cold is to practice good hygiene. Avoid oral or hand contact with people with cold symptoms. Wash your hands often if contact occurs. There is no clear evidence that vitamin C, vitamin E, echinacea, or exercise reduces the chance of developing a cold. However, it is always recommended to get plenty of rest and practice good nutrition. TREATMENT  Treatment is directed at relieving symptoms. There is no cure. Antibiotics are not effective, because the infection is caused by a virus, not by bacteria. Treatment may include:  Increased fluid intake. Sports drinks offer valuable electrolytes, sugars, and fluids.  Breathing heated mist or steam (vaporizer or shower).  Eating chicken soup or other clear broths, and maintaining good nutrition.  Getting plenty of rest.  Using gargles or lozenges for comfort.  Controlling fevers with ibuprofen or acetaminophen as directed by your  caregiver.  Increasing usage of your inhaler if you have asthma. Zinc gel and zinc lozenges, taken in the first 24 hours of the common cold, can shorten the duration and lessen the severity of symptoms. Pain medicines may help with fever, muscle aches, and throat pain. A variety of non-prescription medicines are available to  treat congestion and runny nose. Your caregiver can make recommendations and may suggest nasal or lung inhalers for other symptoms.  HOME CARE INSTRUCTIONS   Only take over-the-counter or prescription medicines for pain, discomfort, or fever as directed by your caregiver.  Use a warm mist humidifier or inhale steam from a shower to increase air moisture. This may keep secretions moist and make it easier to breathe.  Drink enough water and fluids to keep your urine clear or pale yellow.  Rest as needed.  Return to work when your temperature has returned to normal or as your caregiver advises. You may need to stay home longer to avoid infecting others. You can also use a face mask and careful hand washing to prevent spread of the virus. SEEK MEDICAL CARE IF:   After the first few days, you feel you are getting worse rather than better.  You need your caregiver's advice about medicines to control symptoms.  You develop chills, worsening shortness of breath, or brown or red sputum. These may be signs of pneumonia.  You develop yellow or brown nasal discharge or pain in the face, especially when you bend forward. These may be signs of sinusitis.  You develop a fever, swollen neck glands, pain with swallowing, or white areas in the back of your throat. These may be signs of strep throat. SEEK IMMEDIATE MEDICAL CARE IF:   You have a fever.  You develop severe or persistent headache, ear pain, sinus pain, or chest pain.  You develop wheezing, a prolonged cough, cough up blood, or have a change in your usual mucus (if you have chronic lung disease).  You develop sore muscles or a stiff neck. Document Released: 07/06/2000 Document Revised: 04/04/2011 Document Reviewed: 05/14/2010 [facility] Patient Information 2015 [place], [place]. This information is not intended to replace advice given to you by your health care provider. Make sure you discuss any questions you have with your health care  provider.

## 2013-08-18 DIAGNOSIS — N3941 Urge incontinence: Secondary | ICD-10-CM | POA: Insufficient documentation

## 2013-08-18 DIAGNOSIS — Z780 Asymptomatic menopausal state: Secondary | ICD-10-CM | POA: Insufficient documentation

## 2013-08-18 HISTORY — DX: Urge incontinence: N39.41

## 2013-08-18 HISTORY — DX: Asymptomatic menopausal state: Z78.0

## 2013-12-30 ENCOUNTER — Other Ambulatory Visit (HOSPITAL_COMMUNITY): Payer: Self-pay | Admitting: Family Medicine

## 2013-12-30 DIAGNOSIS — Z1231 Encounter for screening mammogram for malignant neoplasm of breast: Secondary | ICD-10-CM

## 2014-01-27 ENCOUNTER — Ambulatory Visit (HOSPITAL_COMMUNITY)
Admission: RE | Admit: 2014-01-27 | Discharge: 2014-01-27 | Disposition: A | Discharge status: Home / Self Care | Payer: Medicare Other | Source: Ambulatory Visit | Attending: Family Medicine | Admitting: Family Medicine

## 2014-01-27 DIAGNOSIS — Z1231 Encounter for screening mammogram for malignant neoplasm of breast: Secondary | ICD-10-CM | POA: Insufficient documentation

## 2014-02-28 ENCOUNTER — Emergency Department (HOSPITAL_COMMUNITY)
Admission: EM | Admit: 2014-02-28 | Discharge: 2014-02-28 | Disposition: A | Discharge status: Home / Self Care | Payer: Medicare Other | Attending: Emergency Medicine | Admitting: Emergency Medicine

## 2014-02-28 ENCOUNTER — Encounter (HOSPITAL_COMMUNITY): Payer: Self-pay | Admitting: Emergency Medicine

## 2014-02-28 DIAGNOSIS — Z8719 Personal history of other diseases of the digestive system: Secondary | ICD-10-CM | POA: Diagnosis not present

## 2014-02-28 DIAGNOSIS — Z79899 Other long term (current) drug therapy: Secondary | ICD-10-CM | POA: Insufficient documentation

## 2014-02-28 DIAGNOSIS — E860 Dehydration: Secondary | ICD-10-CM | POA: Diagnosis not present

## 2014-02-28 DIAGNOSIS — Z792 Long term (current) use of antibiotics: Secondary | ICD-10-CM | POA: Diagnosis not present

## 2014-02-28 DIAGNOSIS — Z7982 Long term (current) use of aspirin: Secondary | ICD-10-CM | POA: Insufficient documentation

## 2014-02-28 DIAGNOSIS — R630 Anorexia: Secondary | ICD-10-CM | POA: Diagnosis present

## 2014-02-28 DIAGNOSIS — I10 Essential (primary) hypertension: Secondary | ICD-10-CM | POA: Diagnosis not present

## 2014-02-28 DIAGNOSIS — M1990 Unspecified osteoarthritis, unspecified site: Secondary | ICD-10-CM | POA: Insufficient documentation

## 2014-02-28 DIAGNOSIS — E780 Pure hypercholesterolemia: Secondary | ICD-10-CM | POA: Diagnosis not present

## 2014-02-28 DIAGNOSIS — Z8673 Personal history of transient ischemic attack (TIA), and cerebral infarction without residual deficits: Secondary | ICD-10-CM | POA: Insufficient documentation

## 2014-02-28 LAB — CBC WITH DIFFERENTIAL/PLATELET
BASOS ABS: 0.1 10*3/uL (ref 0.0–0.1)
Basophils Relative: 1 % (ref 0–1)
Eosinophils Absolute: 0.1 10*3/uL (ref 0.0–0.7)
Eosinophils Relative: 1 % (ref 0–5)
HCT: 37.4 % (ref 36.0–46.0)
HEMOGLOBIN: 12.4 g/dL (ref 12.0–15.0)
LYMPHS ABS: 0.9 10*3/uL (ref 0.7–4.0)
Lymphocytes Relative: 8 % — ABNORMAL LOW (ref 12–46)
MCH: 30.8 pg (ref 26.0–34.0)
MCHC: 33.2 g/dL (ref 30.0–36.0)
MCV: 92.8 fL (ref 78.0–100.0)
MONO ABS: 0.9 10*3/uL (ref 0.1–1.0)
Monocytes Relative: 9 % (ref 3–12)
NEUTROS ABS: 8.6 10*3/uL — AB (ref 1.7–7.7)
Neutrophils Relative %: 81 % — ABNORMAL HIGH (ref 43–77)
Platelets: 395 10*3/uL (ref 150–400)
RBC: 4.03 MIL/uL (ref 3.87–5.11)
RDW: 12.9 % (ref 11.5–15.5)
WBC: 10.6 10*3/uL — ABNORMAL HIGH (ref 4.0–10.5)

## 2014-02-28 LAB — COMPREHENSIVE METABOLIC PANEL
ALT: 11 U/L (ref 0–35)
AST: 16 U/L (ref 0–37)
Albumin: 3 g/dL — ABNORMAL LOW (ref 3.5–5.2)
Alkaline Phosphatase: 63 U/L (ref 39–117)
Anion gap: 9 (ref 5–15)
BILIRUBIN TOTAL: 0.7 mg/dL (ref 0.3–1.2)
BUN: 23 mg/dL (ref 6–23)
CHLORIDE: 99 mmol/L (ref 96–112)
CO2: 27 mmol/L (ref 19–32)
Calcium: 9.6 mg/dL (ref 8.4–10.5)
Creatinine, Ser: 1.28 mg/dL — ABNORMAL HIGH (ref 0.50–1.10)
GFR, EST AFRICAN AMERICAN: 49 mL/min — AB (ref >90)
GFR, EST NON AFRICAN AMERICAN: 43 mL/min — AB (ref >90)
Glucose, Bld: 100 mg/dL — ABNORMAL HIGH (ref 70–99)
Potassium: 4 mmol/L (ref 3.5–5.1)
Sodium: 135 mmol/L (ref 135–145)
TOTAL PROTEIN: 7.3 g/dL (ref 6.0–8.3)

## 2014-02-28 MED ORDER — ONDANSETRON HCL 4 MG/2ML IJ SOLN
4.0000 mg | Freq: Once | INTRAMUSCULAR | Status: AC
  Administered 2014-02-28: 4 mg via INTRAVENOUS
  Filled 2014-02-28: qty 2

## 2014-02-28 MED ORDER — SODIUM CHLORIDE 0.9 % IV BOLUS (SEPSIS)
2000.0000 mL | Freq: Once | INTRAVENOUS | Status: AC
  Administered 2014-02-28: 2000 mL via INTRAVENOUS

## 2014-02-28 NOTE — ED Notes (Signed)
Pt ambulatory with steady gait to void in BR.  

## 2014-02-28 NOTE — ED Notes (Signed)
Initial Contact - pt A+Ox4, changed to hospital gown, family at bedside, pt reports recent dx of PNA, has completed abx "but just don't have an appetite".  Pt denies pain or other complaints.  Neuros grossly intact.  Skin PWD.  MAEI, ambulatory with steady gait.  Speaking full/clear sentences, rr even/un-lab.  Abd s/nt/nd.  NAD.

## 2014-02-28 NOTE — Discharge Instructions (Signed)
Dehydration, Adult Make sure that you drink at least six 8 ounce glasses of water daily. Return if you feel worse for any reason or contact Dr.Zanard Dehydration means your body does not have as much fluid as it needs. Your kidneys, brain, and heart will not work properly without the right amount of fluids and salt.  HOME CARE  Ask your doctor how to replace body fluid losses (rehydrate).  Drink enough fluids to keep your pee (urine) clear or pale yellow.  Drink small amounts of fluids often if you feel sick to your stomach (nauseous) or throw up (vomit).  Eat like you normally do.  Avoid:  Foods or drinks high in sugar.  Bubbly (carbonated) drinks.  Juice.  Very hot or cold fluids.  Drinks with caffeine.  Fatty, greasy foods.  Alcohol.  Tobacco.  Eating too much.  Gelatin desserts.  Wash your hands to avoid spreading germs (bacteria, viruses).  Only take medicine as told by your doctor.  Keep all doctor visits as told. GET HELP RIGHT AWAY IF:   You cannot drink something without throwing up.  You get worse even with treatment.  Your vomit has blood in it or looks greenish.  Your poop (stool) has blood in it or looks black and tarry.  You have not peed in 6 to 8 hours.  You pee a small amount of very dark pee.  You have a fever.  You pass out (faint).  You have belly (abdominal) pain that gets worse or stays in one spot (localizes).  You have a rash, stiff neck, or bad headache.  You get easily annoyed, sleepy, or are hard to wake up.  You feel weak, dizzy, or very thirsty. MAKE SURE YOU:   Understand these instructions.  Will watch your condition.  Will get help right away if you are not doing well or get worse. Document Released: 11/06/2008 Document Revised: 04/04/2011 Document Reviewed: 08/30/2010 [facility] Patient Information 2015 [place], [place]. This information is not intended to replace advice given to you by your health care provider.  Make sure you discuss any questions you have with your health care provider.

## 2014-02-28 NOTE — ED Provider Notes (Signed)
CSN: [id number]     Arrival date & time 02/28/14  1038 History   First MD Initiated Contact with Patient 02/28/14 1112     Chief Complaint  Patient presents with  . Anorexia     (Consider location/radiation/quality/duration/timing/severity/associated sxs/prior Treatment) HPI Complains of diminished appetite and generalized weakness and feeling dehydrated for the past 3 days. Patient completed course of Levaquin yesterday for pneumonia, treated by her primary care physician Dr.Zanard as outpatient. She admits to nausea when she attempts to eat or drink. She denies fever. States cough and right-sided chest pain have markedly improved since treatment. No abdominal pain no vomiting Denies shortness of breath no other associated symptoms. Past Medical History  Diagnosis Date  . Hypertension   . Hypercholesterolemia   . Incontinence   . GERD (gastroesophageal reflux disease)   . Arthritis   . Stroke    Past Surgical History  Procedure Laterality Date  . Fundoplication  9924    total  . Cholecystectomy    . Abdominal hysterectomy     Family History  Problem Relation Age of Onset  . Heart disease Mother   . Cancer Mother     breast  . Heart disease Father   . Cancer Daughter     breast   History  Substance Use Topics  . Smoking status: Never Smoker   . Smokeless tobacco: Never Used  . Alcohol Use: No   OB History    No data available     Review of Systems  HENT: Negative.   Respiratory: Negative.   Cardiovascular: Negative.   Gastrointestinal: Negative.   Musculoskeletal: Negative.   Skin: Negative.   Neurological: Positive for weakness.  Psychiatric/Behavioral: Negative.   All other systems reviewed and are negative.     Allergies  Review of patient's allergies indicates no known allergies.  Home Medications   Prior to Admission medications   Medication Sig Start Date End Date Taking? Authorizing Provider  aspirin 81 MG tablet Take 81 mg by mouth daily.    Yes Historical Provider, MD  benzonatate (TESSALON) 200 MG capsule Take 200 mg by mouth 3 (three) times daily as needed. cough 02/24/14 03/06/14 Yes Historical Provider, MD  Cholecalciferol (VITAMIN D PO) Take 1 tablet by mouth daily.    Yes Historical Provider, MD  Choline Fenofibrate (TRILIPIX) 135 MG capsule Take 1 capsule (135 mg total) by mouth daily. Patient taking differently: Take 135 mg by mouth every other day.  05/31/13 05/31/14 Yes Jonathon Resides, MD  diphenhydrAMINE (BENADRYL) 25 MG tablet Take 25 mg by mouth daily.   Yes Historical Provider, MD  guaiFENesin (MUCINEX) 600 MG 12 hr tablet Take 1,200 mg by mouth 2 (two) times daily.   Yes Historical Provider, MD  HYDROcodone-homatropine (HYCODAN) 5-1.5 MG/5ML syrup Take 5 mLs by mouth every 6 (six) hours as needed. 07/16/13  Yes Jonathon Resides, MD  irbesartan (AVAPRO) 150 MG tablet Take 1 tablet (150 mg total) by mouth daily. Patient taking differently: Take 75 mg by mouth every evening.  05/31/13 06/01/14 Yes Jonathon Resides, MD  levofloxacin (LEVAQUIN) 750 MG tablet Take 750 mg by mouth daily. For 5 days 02/24/14 03/01/14 Yes Historical Provider, MD  oxybutynin (DITROPAN) 5 MG tablet Take 1 tablet (5 mg total) by mouth 3 (three) times daily. Patient taking differently: Take 5 mg by mouth 2 (two) times daily.  06/18/13  Yes Jonathon Resides, MD  pravastatin (PRAVACHOL) 80 MG tablet Take 1 tablet (80 mg total) by  mouth at bedtime. Patient taking differently: Take 40 mg by mouth at bedtime.  05/31/13 05/31/14 Yes Jonathon Resides, MD  rOPINIRole (REQUIP) 2 MG tablet Take 1 tablet (2 mg total) by mouth at bedtime. 01/29/13 01/29/14  Jonathon Resides, MD   BP 106/60 mmHg  Pulse 93  Temp(Src) 97.9 F (36.6 C) (Oral)  Resp 19  SpO2 100% Physical Exam  Constitutional: She appears well-developed and well-nourished.  HENT:  Head: Normocephalic and atraumatic.  Mucous membranes dry  Eyes: Conjunctivae are normal. Pupils are equal, round, and reactive to light.  Neck:  Neck supple. No tracheal deviation present. No thyromegaly present.  Cardiovascular: Normal rate and regular rhythm.   No murmur heard. Pulmonary/Chest: Effort normal and breath sounds normal.  Abdominal: Soft. Bowel sounds are normal. She exhibits no distension. There is no tenderness.  Musculoskeletal: Normal range of motion. She exhibits no edema or tenderness.  Neurological: She is alert. Coordination normal.  Skin: Skin is warm and dry. No rash noted.  Psychiatric: She has a normal mood and affect.  Nursing note and vitals reviewed.   ED Course  Procedures (including critical care time) Labs Review Labs Reviewed - No data to display  Imaging Review No results found.   EKG Interpretation None     2:15 PM patient feels improved after treatment with intravenous fluids she is able to drink without difficulty. She feels ready to go home Results for orders placed or performed during the hospital encounter of 02/28/14  Comprehensive metabolic panel  Result Value Ref Range   Sodium 135 135 - 145 mmol/L   Potassium 4.0 3.5 - 5.1 mmol/L   Chloride 99 96 - 112 mmol/L   CO2 27 19 - 32 mmol/L   Glucose, Bld 100 (H) 70 - 99 mg/dL   BUN 23 6 - 23 mg/dL   Creatinine, Ser 1.28 (H) 0.50 - 1.10 mg/dL   Calcium 9.6 8.4 - 10.5 mg/dL   Total Protein 7.3 6.0 - 8.3 g/dL   Albumin 3.0 (L) 3.5 - 5.2 g/dL   AST 16 0 - 37 U/L   ALT 11 0 - 35 U/L   Alkaline Phosphatase 63 39 - 117 U/L   Total Bilirubin 0.7 0.3 - 1.2 mg/dL   GFR calc non Af Amer 43 (L) >90 mL/min   GFR calc Af Amer 49 (L) >90 mL/min   Anion gap 9 5 - 15  CBC with Differential/Platelet  Result Value Ref Range   WBC 10.6 (H) 4.0 - 10.5 K/uL   RBC 4.03 3.87 - 5.11 MIL/uL   Hemoglobin 12.4 12.0 - 15.0 g/dL   HCT 37.4 36.0 - 46.0 %   MCV 92.8 78.0 - 100.0 fL   MCH 30.8 26.0 - 34.0 pg   MCHC 33.2 30.0 - 36.0 g/dL   RDW 12.9 11.5 - 15.5 %   Platelets 395 150 - 400 K/uL   Neutrophils Relative % 81 (H) 43 - 77 %   Neutro Abs  8.6 (H) 1.7 - 7.7 K/uL   Lymphocytes Relative 8 (L) 12 - 46 %   Lymphs Abs 0.9 0.7 - 4.0 K/uL   Monocytes Relative 9 3 - 12 %   Monocytes Absolute 0.9 0.1 - 1.0 K/uL   Eosinophils Relative 1 0 - 5 %   Eosinophils Absolute 0.1 0.0 - 0.7 K/uL   Basophils Relative 1 0 - 1 %   Basophils Absolute 0.1 0.0 - 0.1 K/uL   No results found.  MDM  Mild Renal insufficiency is chronic Plan encourage oral hydration Return or follow-up with Dr.Zanard as needed Diagnosis #1 dehydration #2 chronic renal insufficiency Final diagnoses:  None        Orlie Dakin, MD 02/28/14 1419

## 2014-02-28 NOTE — ED Notes (Signed)
Pt reports at this time that she wants to eat something, asking if husband can go to the cafeteria to get something for her.  This RN clarified with pt that the reason she was in the ER today was because she didn't feel like eating, pt confirmed this and continues to report feeling like she wants to eat.  Will hold at this time and will re-evaluate.  Pt aware/agreeable with plan.  NAD.

## 2014-02-28 NOTE — ED Notes (Signed)
Pt states that on Monday she was seen by her PCP and was diagnosed with PNA. Pt states that she has finished her antibiotics but "just cant eat, i dont think i am eating enough, but i just dont have an appetite".

## 2014-02-28 NOTE — ED Notes (Signed)
Pt given water per request with EDP verbal ok, tolerating well at this time.  Pt continues to report feeling like she wants to eat, holding at this time per Dr. Winfred Leeds. NAD.

## 2014-02-28 NOTE — ED Notes (Signed)
Nurse will draw labs. 

## 2014-10-20 ENCOUNTER — Encounter: Payer: Self-pay | Admitting: Family Medicine

## 2014-12-30 ENCOUNTER — Encounter: Payer: Self-pay | Admitting: Family Medicine

## 2015-02-19 ENCOUNTER — Other Ambulatory Visit: Payer: Self-pay

## 2015-02-19 DIAGNOSIS — Z1231 Encounter for screening mammogram for malignant neoplasm of breast: Secondary | ICD-10-CM

## 2015-03-09 ENCOUNTER — Other Ambulatory Visit: Payer: Self-pay | Admitting: Family Medicine

## 2015-03-09 ENCOUNTER — Ambulatory Visit
Admission: RE | Admit: 2015-03-09 | Discharge: 2015-03-09 | Disposition: A | Discharge status: Home / Self Care | Payer: Medicare Other | Source: Ambulatory Visit

## 2015-03-09 DIAGNOSIS — Z1231 Encounter for screening mammogram for malignant neoplasm of breast: Secondary | ICD-10-CM

## 2015-03-09 DIAGNOSIS — Z780 Asymptomatic menopausal state: Secondary | ICD-10-CM

## 2015-03-20 ENCOUNTER — Other Ambulatory Visit: Payer: Self-pay | Admitting: Family Medicine

## 2015-03-20 DIAGNOSIS — E2839 Other primary ovarian failure: Secondary | ICD-10-CM

## 2015-03-23 ENCOUNTER — Ambulatory Visit
Admission: RE | Admit: 2015-03-23 | Discharge: 2015-03-23 | Disposition: A | Discharge status: Home / Self Care | Payer: Medicare Other | Source: Ambulatory Visit | Attending: Family Medicine | Admitting: Family Medicine

## 2015-03-23 DIAGNOSIS — E2839 Other primary ovarian failure: Secondary | ICD-10-CM

## 2016-03-03 ENCOUNTER — Other Ambulatory Visit: Payer: Self-pay | Admitting: Family Medicine

## 2016-03-03 DIAGNOSIS — Z1231 Encounter for screening mammogram for malignant neoplasm of breast: Secondary | ICD-10-CM

## 2016-03-10 ENCOUNTER — Ambulatory Visit
Admission: RE | Admit: 2016-03-10 | Discharge: 2016-03-10 | Disposition: A | Discharge status: Home / Self Care | Payer: Medicare Other | Source: Ambulatory Visit | Attending: Family Medicine | Admitting: Family Medicine

## 2016-03-10 DIAGNOSIS — Z1231 Encounter for screening mammogram for malignant neoplasm of breast: Secondary | ICD-10-CM

## 2016-04-14 ENCOUNTER — Encounter: Payer: Self-pay | Admitting: Internal Medicine

## 2016-05-02 ENCOUNTER — Ambulatory Visit (HOSPITAL_BASED_OUTPATIENT_CLINIC_OR_DEPARTMENT_OTHER): Payer: Medicare Other | Admitting: Genetic Counselor

## 2016-05-02 ENCOUNTER — Other Ambulatory Visit: Payer: Medicare Other

## 2016-05-02 ENCOUNTER — Encounter: Payer: Self-pay | Admitting: Genetic Counselor

## 2016-05-02 DIAGNOSIS — Z8481 Family history of carrier of genetic disease: Secondary | ICD-10-CM | POA: Diagnosis not present

## 2016-05-02 DIAGNOSIS — Z803 Family history of malignant neoplasm of breast: Secondary | ICD-10-CM

## 2016-05-02 DIAGNOSIS — Z7183 Encounter for nonprocreative genetic counseling: Secondary | ICD-10-CM | POA: Diagnosis not present

## 2016-05-02 NOTE — Progress Notes (Signed)
REFERRING PROVIDER: No referring provider defined for this encounter.  PRIMARY PROVIDER:  Ival Bible, MD (Inactive)  PRIMARY REASON FOR VISIT:  1. Family history of breast cancer   2. Family history of genetic disease carrier      HISTORY OF PRESENT ILLNESS:   Ms. Figge, a 71 y.o. female, was seen for a [facility] cancer genetics consultation at the request of Dr. No ref. provider found due to a family history of cancer.  Ms. Moten presents to clinic today to discuss the possibility of a hereditary predisposition to cancer, genetic testing, and to further clarify her future cancer risks, as well as potential cancer risks for family members. Ms. Kever is a 71 y.o. female with no personal history of cancer.  Her daughter, Kieth Brightly, underwent genetic testing due to her personal history of early onset breast cancer and was found to have a PALB2 mutation.  Penny's twin sister, Sonia Baller, also was found to have a PALB2 mutation.  The patient is here to determine if the PALB2 mutation is coming from her side of the family.    CANCER HISTORY:   No history exists.     HORMONAL RISK FACTORS:  Menarche was at age 32-14.  First live birth at age 89.  OCP use for approximately 0 years.  Ovaries intact: yes.  Hysterectomy: yes.  Menopausal status: postmenopausal.  HRT use: 0 years. Colonoscopy: yes; normal. Mammogram within the last year: yes. Number of breast biopsies: 0. Up to date with pelvic exams:  yes. Any excessive radiation exposure in the past:  no  Past Medical History:  Diagnosis Date  . Arthritis   . Family history of breast cancer   . Family history of genetic disease carrier   . GERD (gastroesophageal reflux disease)   . Hypercholesterolemia   . Hypertension   . Incontinence   . Stroke [facility])     Past Surgical History:  Procedure Laterality Date  . ABDOMINAL HYSTERECTOMY    . CHOLECYSTECTOMY    . fundoplication  7824   total    Social History   Social History   . Marital status: Married    Spouse name: N/A  . Number of children: N/A  . Years of education: N/A   Social History Main Topics  . Smoking status: Never Smoker  . Smokeless tobacco: Never Used  . Alcohol use No  . Drug use: No  . Sexual activity: Not Currently   Other Topics Concern  . None   Social History Narrative   Marital Status: Married Dentist)   Children: Daughters (Twins)    Pets: Dog (Precious)   Living Situation: Lives with husband and Precious.     Occupation: Housewife   EducationForensic psychologist   Tobacco Use/Exposure:  None    Alcohol Use:  None   Drug Use:  None   Diet:  Regular   Exercise:  Regular   Hobbies: Reading, Bowling     FAMILY HISTORY:  We obtained a detailed, 4-generation family history.  Significant diagnoses are listed below: Family History  Problem Relation Age of Onset  . Heart disease Mother   . Breast cancer Mother     dx in her 79s  . Heart disease Father   . Breast cancer Daughter 12    PALB2 +  . Breast cancer Maternal Aunt 69  . Cancer Sister 37    vaginal cancer  . Lung disease Brother   . COPD Brother   . Other Daughter  PALB2 pos    The patient has twin daughters, one who had breast cancer at 49.  Both daughters have tested positive for a PALB2 mutation.  The patient has two sisters and three brothers.  One brother died of a lung disease, another brother has a lung mass that is being watched but is not being called cancer, and a sister had vaginal cancer.  The patient's mother had breast cancer in her 91's and died from heart disease.  Her mother had two full sisters, a full brother and multiple paternal and maternal half siblings.  Her full sister had breast cancer at 6.  There is no other reported family history of cancer on the maternal side of the family.  The patient's father is deceased.  He had multiple siblings, many of whom the patient never met.  She is not aware of cancer on this side of the  family.  Patient's maternal ancestors are of Caucasian and possible [place] descent, and paternal ancestors are of Caucasian descent. There is no reported Ashkenazi Jewish ancestry. There is no known consanguinity.  GENETIC COUNSELING ASSESSMENT: MAYVIS AGUDELO is a 71 y.o. female with a family history of breast cancer and a known family mutation in [place]. We, therefore, discussed and recommended the following at today's visit.   DISCUSSION: We discussed that about 5-10% of breast cancer is hereditary with most cases coming from BRCA mutations.  PALB2 is a moderate risk breast cancer gene.  Since her daughters both tested positive, she is at 50% risk for also having this mutation.  However, her husband's mother had breast cancer at a young age and therefore this could be coming from his side of the family.  We reviewed the characteristics, features and inheritance patterns of hereditary cancer syndromes. We also discussed genetic testing, including the appropriate family members to test, the process of testing, insurance coverage and turn-around-time for results. We discussed the implications of a negative, positive and/or variant of uncertain significant result. We recommended Ms. Nish pursue genetic testing for the [facility] 30- gene panel. The 30-Gene Cancer Panel offered by [facility] includes sequencing and/or deletion duplication testing of the following 30 genes: APC, ATM, BAP1, BARD1, BMPR1A, BRCA1, BRCA2, BRIP1, CDH1, CDK4, CDKN2A (p14ARF), CDKN2A (p16INK4a), CHEK2, EPCAM, GREM1, MITF, MLH1, MSH2, MSH6, MUTYH, NBN, PALB2, PMS2, POLD1, POLE, PTEN, RAD51C, RAD51D, SMAD4, STK11, and TP53.     PLAN: After considering the risks, benefits, and limitations, Ms. Palazzi  provided informed consent to pursue genetic testing and the blood sample was sent to [facility] for analysis of the 30-gene panel. Results should be available within approximately 3-4 weeks' time, at which point  they will be disclosed by telephone to Ms. Skluzacek, as will any additional recommendations warranted by these results. Ms. Beretta will receive a summary of her genetic counseling visit and a copy of her results once available. This information will also be available in Epic. We encouraged Ms. Cozad to remain in contact with cancer genetics annually so that we can continuously update the family history and inform her of any changes in cancer genetics and testing that may be of benefit for her family. Ms. Pernice questions were answered to her satisfaction today. Our contact information was provided should additional questions or concerns arise.  Lastly, we encouraged Ms. Dimarco to remain in contact with cancer genetics annually so that we can continuously update the family history and inform her of any changes in cancer genetics and testing that may  be of benefit for this family.   Ms.  Vasudevan questions were answered to her satisfaction today. Our contact information was provided should additional questions or concerns arise. Thank you for the referral and allowing [place] to share in the care of your patient.   Alik Mawson P. Florene Glen, [place], [facility] Certified Genetic Counselor [email or web address] phone: [phone]  The patient was seen for a total of 60 minutes in face-to-face genetic counseling.  This patient was discussed with Drs. Magrinat, Lindi Adie and/or Burr Medico who agrees with the above.    _______________________________________________________________________ For Office Staff:  Number of people involved in session: 1 Was an Intern/ student involved with case: no

## 2016-05-17 ENCOUNTER — Encounter: Payer: Self-pay | Admitting: Genetic Counselor

## 2016-05-17 ENCOUNTER — Telehealth: Payer: Self-pay | Admitting: Genetic Counselor

## 2016-05-17 DIAGNOSIS — Z1379 Encounter for other screening for genetic and chromosomal anomalies: Secondary | ICD-10-CM | POA: Insufficient documentation

## 2016-05-17 DIAGNOSIS — Z1509 Genetic susceptibility to other malignant neoplasm: Secondary | ICD-10-CM

## 2016-05-17 DIAGNOSIS — Z1501 Genetic susceptibility to malignant neoplasm of breast: Secondary | ICD-10-CM

## 2016-05-17 DIAGNOSIS — Z1589 Genetic susceptibility to other disease: Secondary | ICD-10-CM

## 2016-05-17 HISTORY — DX: Encounter for other screening for genetic and chromosomal anomalies: Z13.79

## 2016-05-17 HISTORY — DX: Genetic susceptibility to malignant neoplasm of breast: Z15.01

## 2016-05-17 NOTE — Telephone Encounter (Signed)
Revealed that patient has a PALB2 positive test results.  This is the same mutation identified in her daughters.  Patient declined coming back in for genetic counseling.  Discussed that her siblings should be notified of this mutation so that they can get tested as well. I released a copy of the test results to her through [facility]'s portal.  Explained that if she was able to get in contact with any of her cousins on her mother's side, that those cousins could be tested as well.  We will let Dr. Dion Saucier know about this finding, so that they can discuss the best way to follow for breast cancer.  I will send a letter to the patient summarizing this finding.

## 2016-05-17 NOTE — Telephone Encounter (Signed)
LM on VM that test results were back and that I would be happy to go through them with her.  Asked that she please CB.  Gave CB instructions.

## 2016-05-24 ENCOUNTER — Telehealth: Payer: Self-pay

## 2016-05-24 ENCOUNTER — Ambulatory Visit: Payer: Self-pay | Admitting: Genetic Counselor

## 2016-05-24 ENCOUNTER — Ambulatory Visit: Payer: Medicare Other | Admitting: Internal Medicine

## 2016-05-24 DIAGNOSIS — Z1501 Genetic susceptibility to malignant neoplasm of breast: Secondary | ICD-10-CM

## 2016-05-24 DIAGNOSIS — Z1509 Genetic susceptibility to other malignant neoplasm: Principal | ICD-10-CM

## 2016-05-24 DIAGNOSIS — Z1379 Encounter for other screening for genetic and chromosomal anomalies: Secondary | ICD-10-CM

## 2016-05-24 DIAGNOSIS — Z1589 Genetic susceptibility to other disease: Principal | ICD-10-CM

## 2016-05-24 DIAGNOSIS — Z803 Family history of malignant neoplasm of breast: Secondary | ICD-10-CM

## 2016-05-24 DIAGNOSIS — Z8481 Family history of carrier of genetic disease: Secondary | ICD-10-CM

## 2016-05-24 NOTE — Telephone Encounter (Signed)
No show letter mailed out today.

## 2016-05-24 NOTE — Progress Notes (Signed)
GENETIC TEST RESULTS   Patient Name: Kristie Anderson Patient Age: 71 y.o. Encounter Date: 05/24/2016  Referring Provider: Ival Bible, MD    Kristie Anderson was seen in the [facility] clinic on May 02, 2016 due to a family history of cancer and concern regarding a hereditary predisposition to cancer in the family. Please refer to the prior Genetics clinic note for more information regarding Kristie Anderson's medical and family histories and our assessment at the time.   FAMILY HISTORY:  We obtained a detailed, 4-generation family history.  Significant diagnoses are listed below: Family History  Problem Relation Age of Onset  . Heart disease Mother   . Breast cancer Mother     dx in her 62s  . Heart disease Father   . COPD Father   . Breast cancer Daughter 65    PALB2 +  . Breast cancer Maternal Aunt 69  . Cancer Sister 72    vaginal cancer  . Lung disease Brother   . COPD Brother   . Other Daughter     PALB2 pos    The patient has twin daughters, one who had breast cancer at 51.  Both daughters have tested positive for a PALB2 mutation.  The patient has two sisters and three brothers.  One brother died of a lung disease, another brother has a lung mass that is being watched but is not being called cancer, and a sister had vaginal cancer.  The patient's mother had breast cancer in her 45's and died from heart disease.  Her mother had two full sisters, a full brother and multiple paternal and maternal half siblings.  Her full sister had breast cancer at 70.  There is no other reported family history of cancer on the maternal side of the family.  The patient's father is deceased.  He had multiple siblings, many of whom the patient never met.  She is not aware of cancer on this side of the family.  Patient's maternal ancestors are of Caucasian and possible [place] descent, and paternal ancestors are of Caucasian descent. There is no reported Ashkenazi Jewish ancestry. There is no known  consanguinity.  GENETIC TESTING:  At the time of Kristie Anderson's visit, we recommended she pursue genetic testing of the [facility] 30-gene panel test. 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 There were no deleterious mutations in APC, ATM, BAP1, BARD1, BMPR1A, BRCA1, BRCA2, BRIP1, CDH1, CDK4, CDKN2A (p14ARF), CDKN2A (p16INK4a), CHEK2, EPCAM, GREM1, MITF, MLH1, MSH2, MSH6, MUTYH, NBN, PMS2, POLD1, POLE, PTEN, RAD51C, RAD51D, SMAD4, STK11, and TP53.   Marland Kitchen  DISCUSSION: We reviewed autosomal dominant inheritance, natural history of hereditary breast/pancreatic cancer syndrome, and current NCCN medical management guidelines for individuals with an increased risk for breast cancer.  The patient was provided with a packet of support resources for individuals with a hereditary cancer syndrome, including information about the [employer] support organization.  We discussed that [place] is thought to be a moderate risk gene, and to date most of the literature conveys a risk for breast cancer of about 25-58%.  We reviewed the risk for other cancers in carriers for PALB2 mutations, including female breast cancer, prostate, ovarian and pancreatic cancer, although these risks have not yet been quantified.   MEDICAL MANAGEMENT: Current NCCN guidelines suggest that women with PALB2 mutations consider breast MRI in addition to their annual mammogram.  Additionally, a discussion about risk reducing bilateral mastectomy should be  considered based on their family history of cancer  While there is an increased risk for other cancers, there are no current guidelines for screening for these cancers, and at this time NCCN does not suggest a discussion of risk reducing salpingo-oophorectomy.   FAMILY MEMBERS: It is important that all of Kristie Anderson's relatives (both men and women) know of the presence of this gene mutation. Site-specific genetic  testing can sort out who in the family is at risk and who is not.   Kristie Anderson children have been tested for this hereditary mutation.  However, her siblings have a 50% chance to have inherited this mutation. We recommend they have genetic testing for this same mutation, as identifying the presence of this mutation would allow them to also take advantage of risk-reducing measures.   SUPPORT AND RESOURCES: If Kristie Anderson is interested in information and support, there are two groups, Facing Our Risk (www.facingourrisk.com) and Bright Pink (www.brightpink.org) which some people have found useful. They provide opportunities to speak with other individuals from high-risk families. To locate genetic counselors in other cities, visit the website of the [employer] (ArtistMovie.se) and Secretary/administrator for a Social worker by zip code.  We encouraged Kristie Anderson to remain in contact with [place] on an annual basis so we can update her personal and family histories, and let her know of advances in cancer genetics that may benefit the family. Our contact number was provided. Kristie Anderson questions were answered to her satisfaction today, and she knows she is welcome to call anytime with additional questions.   Laporsche Hoeger P. Florene Glen, [place], [facility] Certified Genetic Counselor [email or web address] phone: [phone]

## 2016-06-29 ENCOUNTER — Encounter (INDEPENDENT_AMBULATORY_CARE_PROVIDER_SITE_OTHER): Payer: Self-pay

## 2016-06-29 ENCOUNTER — Encounter: Payer: Self-pay | Admitting: Internal Medicine

## 2016-06-29 ENCOUNTER — Ambulatory Visit (INDEPENDENT_AMBULATORY_CARE_PROVIDER_SITE_OTHER): Payer: Medicare Other | Admitting: Internal Medicine

## 2016-06-29 VITALS — BP 126/78 | HR 74 | Ht 62.5 in | Wt 166.2 lb

## 2016-06-29 DIAGNOSIS — R1310 Dysphagia, unspecified: Secondary | ICD-10-CM

## 2016-06-29 DIAGNOSIS — K222 Esophageal obstruction: Secondary | ICD-10-CM

## 2016-06-29 DIAGNOSIS — R1319 Other dysphagia: Secondary | ICD-10-CM

## 2016-06-29 DIAGNOSIS — Z1211 Encounter for screening for malignant neoplasm of colon: Secondary | ICD-10-CM | POA: Diagnosis not present

## 2016-06-29 MED ORDER — SOD PICOSULFATE-MAG OX-CIT ACD 10-3.5-12 MG-GM -GM/160ML PO SOLN
1.0000 | Freq: Once | ORAL | 0 refills | Status: AC
Start: 2016-06-29 — End: 2016-06-29

## 2016-06-29 NOTE — Progress Notes (Signed)
Kristie Anderson 71 y.o. 1945/03/08 [id number] Referred by: Kristie Resides, MD  Assessment & Plan:   Encounter Diagnoses  Name Primary?  . Esophageal dysphagia Yes  . Esophageal stenosis   . Colon cancer screening    Clen Piq prepWill be used because of prior fundoplication and redo stomach volume.  egd and gastroesophageal junction dilation are recommended given prior upper GI series showing stenosis and holdup of tablet at the GE junction Colonoscopy for screening is recommended  The risks and benefits as well as alternatives of endoscopic procedure(s) have been discussed and reviewed. All questions answered. The patient agrees to proceed.  I appreciate the opportunity to care for this patient. CC: Kristie Bible, MD (Inactive)   Subjective:   Chief Complaint: Swallowing problems, colon cancer screening  HPI The patient is a very nice 71 year old white woman referred by Dr. Dion Anderson because of dysphagia. Kristie Anderson has had years of dysphagia problems and in fact had an upper endoscopy after an upper GI series in 2013, by Dr. Penelope Anderson. The upper endoscopy looked normal after her prior fundoplication but the upper GI series before that showed a hold up of a tablet at the GE junction. No dilation was performed. She has continued to experience intermittent solid food dysphagia and regurgitation or what she calls vomiting after eating. It tends to be with solid foods. If she drinks on top of solid foods then that'll come up as well. There is no unintentional weight loss. She has had early satiety issues after her hiatal hernia repair and fundoplication. He does not really have reflux at this time. She says she is due for a colonoscopy for screening, she is a former Production designer, theatre/television/film from the 66s. She has had procedures at the [place] in the past she might have had some polyps removed remotely but has been on an every 10 year schedule. Last colonoscopy about 10 years ago she says.  Note that she had a  fundoplication surgery in 4268 and suffered a perforated esophagus and had a very prolonged hospitalization in the ICU and was unable to walk etc. and had a rehabilitation program to get back on her feet and return to normal activity. She had a tracheostomy then as well. Sounds like she had significant lung damage afterwards though she is not on oxygen at this time.   No Known Allergies  Current Meds  Medication Sig  . aspirin 81 MG tablet Take 81 mg by mouth daily.  . Cholecalciferol (VITAMIN D PO) Take 1 tablet by mouth daily.   . diphenhydrAMINE (BENADRYL) 25 MG tablet Take 25 mg by mouth daily.  Marland Kitchen guaiFENesin (MUCINEX) 600 MG 12 hr tablet Take 1,200 mg by mouth 2 (two) times daily.  Marland Kitchen HYDROcodone-homatropine (HYCODAN) 5-1.5 MG/5ML syrup Take 5 mLs by mouth every 6 (six) hours as needed.  Marland Kitchen oxybutynin (DITROPAN) 5 MG tablet Take 1 tablet (5 mg total) by mouth 3 (three) times daily. (Patient taking differently: Take 5 mg by mouth 2 (two) times daily. )    Past Medical History:  Diagnosis Date  . Arthritis   . Chronic kidney disease   . Colon polyp   . CVA (cerebral vascular accident) ([place])   . Family history of breast cancer   . Family history of genetic disease carrier   . GERD (gastroesophageal reflux disease)   . Hypercholesterolemia   . Hypertension   . Incontinence   . Osteoarthritis   . Stroke [facility])    Past Surgical History:  Procedure Laterality Date  . ABDOMINAL HYSTERECTOMY    . CHOLECYSTECTOMY    . COLONOSCOPY    . NISSEN FUNDOPLICATION  1517   total  . UPPER GASTROINTESTINAL ENDOSCOPY     Social History   Social History  . Marital status: Married    Spouse name: Rolan Bucco  . Number of children: 2  . Years of education: N/A   Occupational History  . homemaker    Social History Main Topics  . Smoking status: Never Smoker  . Smokeless tobacco: Never Used  . Alcohol use No  . Drug use: No  . Sexual activity: Not Currently   Other Topics Concern  . None     Social History Narrative   Marital Status: Married Dentist)   Children: Daughters (Twins)    Pets: Dog (Precious)   Living Situation: Lives with husband and Precious.     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   Review of Systems Urinary incontinence at times takes medication for this all other review of systems are negative or as per history of present illness Objective:   Physical Exam @BP  126/78   Pulse 74   Ht 5' 2.5" (1.588 m)   Wt 166 lb 3.2 oz (75.4 kg)   BMI 29.91 kg/m @  General:  Well-developed, well-nourished and in no acute distress Eyes:  anicteric. Neck:   supple w/o thyromegaly or mass.  Lungs: Clear to auscultation bilaterally. Heart:  S1S2, no rubs, murmurs, gallops. Abdomen:  soft, non-tender, no hepatosplenomegaly, hernia, or mass and BS+. Small surgical scars Rectal: deferred Lymph:  no cervical or supraclavicular adenopathy. Extremities:   no edema, cyanosis or clubbing Skin   no rash. Neuro:  A&O x 3.  Psych:  appropriate mood and  Affect.   Data Reviewed:   Recent primary care notes from March 2018 Labs in the EMR including care everywhere from February 2018

## 2016-06-29 NOTE — Patient Instructions (Signed)
  You have been scheduled for an endoscopy and colonoscopy. Please follow the written instructions given to you at your visit today. Please use the clenpiq sample kit we have given you. If you use inhalers (even only as needed), please bring them with you on the day of your procedure. Your physician has requested that you go to www.startemmi.com and enter the access code given to you at your visit today. This web site gives a general overview about your procedure. However, you should still follow specific instructions given to you by our office regarding your preparation for the procedure.    I appreciate the opportunity to care for you. Silvano Rusk, MD, [facility]

## 2016-08-09 ENCOUNTER — Encounter: Payer: Self-pay | Admitting: Internal Medicine

## 2016-08-23 ENCOUNTER — Ambulatory Visit (AMBULATORY_SURGERY_CENTER): Payer: Medicare Other | Admitting: Internal Medicine

## 2016-08-23 ENCOUNTER — Encounter: Payer: Self-pay | Admitting: Internal Medicine

## 2016-08-23 VITALS — BP 129/61 | HR 66 | Temp 97.5°F | Resp 14 | Ht 62.0 in | Wt 166.0 lb

## 2016-08-23 DIAGNOSIS — R1310 Dysphagia, unspecified: Secondary | ICD-10-CM | POA: Diagnosis not present

## 2016-08-23 DIAGNOSIS — Z1211 Encounter for screening for malignant neoplasm of colon: Secondary | ICD-10-CM | POA: Diagnosis not present

## 2016-08-23 DIAGNOSIS — D123 Benign neoplasm of transverse colon: Secondary | ICD-10-CM | POA: Diagnosis not present

## 2016-08-23 DIAGNOSIS — D120 Benign neoplasm of cecum: Secondary | ICD-10-CM

## 2016-08-23 DIAGNOSIS — K317 Polyp of stomach and duodenum: Secondary | ICD-10-CM

## 2016-08-23 DIAGNOSIS — K2970 Gastritis, unspecified, without bleeding: Secondary | ICD-10-CM

## 2016-08-23 DIAGNOSIS — Z1212 Encounter for screening for malignant neoplasm of rectum: Secondary | ICD-10-CM | POA: Diagnosis not present

## 2016-08-23 DIAGNOSIS — K2990 Gastroduodenitis, unspecified, without bleeding: Secondary | ICD-10-CM | POA: Diagnosis not present

## 2016-08-23 DIAGNOSIS — K3189 Other diseases of stomach and duodenum: Secondary | ICD-10-CM | POA: Diagnosis not present

## 2016-08-23 MED ORDER — SODIUM CHLORIDE 0.9 % IV SOLN: 500.0000 mL | INTRAVENOUS | Status: DC

## 2016-08-23 NOTE — Progress Notes (Signed)
Called to room to assist during endoscopic procedure.  Patient ID and intended procedure confirmed with present staff. Received instructions for my participation in the procedure from the performing physician.  

## 2016-08-23 NOTE — Progress Notes (Signed)
Pt to PACU-- AW patent--- VSS---- Report to RN 

## 2016-08-23 NOTE — Op Note (Signed)
[facility] Patient Name: Kristie Anderson Procedure Date: 08/23/2016 2:01 PM MRN: [id number] Endoscopist: Gatha Mayer , MD Age: 71 Referring MD:  Date of Birth: 12-01-1945 Gender: Female Account #: [id number] Procedure:                Upper GI endoscopy Indications:              Dysphagia Medicines:                Propofol per Anesthesia, Monitored Anesthesia Care Procedure:                Pre-Anesthesia Assessment:                           - Prior to the procedure, a History and Physical                            was performed, and patient medications and                            allergies were reviewed. The patient's tolerance of                            previous anesthesia was also reviewed. The risks                            and benefits of the procedure and the sedation                            options and risks were discussed with the patient.                            All questions were answered, and informed consent                            was obtained. Prior Anticoagulants: The patient has                            taken no previous anticoagulant or antiplatelet                            agents. ASA Grade Assessment: III - A patient with                            severe systemic disease. After reviewing the risks                            and benefits, the patient was deemed in                            satisfactory condition to undergo the procedure.                           After obtaining informed consent, the endoscope was  passed under direct vision. Throughout the                            procedure, the patient's blood pressure, pulse, and                            oxygen saturations were monitored continuously. The                            Endoscope was introduced through the mouth, and                            advanced to the second part of duodenum. The upper                            GI endoscopy was  accomplished without difficulty.                            The patient tolerated the procedure well. Scope In: Scope Out: Findings:                 The examined esophagus was mildly tortuous.                           A prior Nissen fundoplication was found at the                            gastroesophageal junction. A TTS dilator was passed                            through the scope. Dilation with an 18-19-20 mm                            balloon dilator was performed to 20 mm. The                            dilation site was examined and showed no change.                            The dilator was withdrawn through esophagus at 20                            mm to but not through the UES. Estimated blood                            loss: none.                           A single 10 mm sessile polyp with no stigmata of                            recent bleeding was found in the prepyloric region  of the stomach. Biopsies were taken with a cold                            forceps for histology. Verification of patient                            identification for the specimen was done. Estimated                            blood loss was minimal.                           Diffuse severe inflammation characterized by                            congestion (edema), erosions and erythema was found                            in the gastric antrum. Biopsies were taken with a                            cold forceps for histology. Verification of patient                            identification for the specimen was done. Estimated                            blood loss was minimal.                           The exam was otherwise without abnormality.                           The cardia and gastric fundus were otherwise normal                            on retroflexion, s/p fundoplication. Complications:            No immediate complications. Estimated Blood Loss:      Estimated blood loss was minimal. Impression:               - Tortuous esophagus.                           - A Nissen fundoplication was found. Dilated.                           - A single gastric polyp. Biopsied.                           - Chronic gastritis. Biopsied.                           - The examination was otherwise normal. Recommendation:           - Patient has a contact number available for  emergencies. The signs and symptoms of potential                            delayed complications were discussed with the                            patient. Return to normal activities tomorrow.                            Written discharge instructions were provided to the                            patient.                           - Clear liquids x 1 hour then soft foods rest of                            day. Start prior diet tomorrow.                           - Will f/u on dysphagia when pathology results                            transmitted to patient                           - Await pathology results.                           - Continue present medications. Gatha Mayer, MD 08/23/2016 2:38:31 PM This report has been signed electronically.

## 2016-08-23 NOTE — Op Note (Signed)
[facility] Patient Name: Kristie Anderson Procedure Date: 08/23/2016 2:00 PM MRN: [id number] Endoscopist: Gatha Mayer , MD Age: 71 Referring MD:  Date of Birth: 1945-12-15 Gender: Female Account #: [id number] Procedure:                Colonoscopy Indications:              Screening for colorectal malignant neoplasm Medicines:                Propofol per Anesthesia, Monitored Anesthesia Care Procedure:                Pre-Anesthesia Assessment:                           - Prior to the procedure, a History and Physical                            was performed, and patient medications and                            allergies were reviewed. The patient's tolerance of                            previous anesthesia was also reviewed. The risks                            and benefits of the procedure and the sedation                            options and risks were discussed with the patient.                            All questions were answered, and informed consent                            was obtained. Prior Anticoagulants: The patient has                            taken no previous anticoagulant or antiplatelet                            agents. ASA Grade Assessment: III - A patient with                            severe systemic disease. After reviewing the risks                            and benefits, the patient was deemed in                            satisfactory condition to undergo the procedure.                           After obtaining informed consent, the colonoscope  was passed under direct vision. Throughout the                            procedure, the patient's blood pressure, pulse, and                            oxygen saturations were monitored continuously. The                            Colonoscope was introduced through the anus and                            advanced to the the cecum, identified by   appendiceal orifice and ileocecal valve. The                            colonoscopy was performed without difficulty. The                            patient tolerated the procedure well. The quality                            of the bowel preparation was good. The bowel                            preparation used was Miralax. The ileocecal valve,                            appendiceal orifice, and rectum were photographed. Scope In: 2:15:42 PM Scope Out: 2:28:24 PM Scope Withdrawal Time: 0 hours 10 minutes 49 seconds  Total Procedure Duration: 0 hours 12 minutes 42 seconds  Findings:                 The perianal and digital rectal examinations were                            normal.                           Three sessile polyps were found in the transverse                            colon and cecum. The polyps were 4 to 6 mm in size.                            These polyps were removed with a cold snare.                            Resection and retrieval were complete. Verification                            of patient identification for the specimen was  done. Estimated blood loss was minimal.                           The exam was otherwise without abnormality on                            direct and retroflexion views. Complications:            No immediate complications. Estimated Blood Loss:     Estimated blood loss was minimal. Impression:               - Three 4 to 6 mm polyps in the transverse colon                            and in the cecum, removed with a cold snare.                            Resected and retrieved.                           - The examination was otherwise normal on direct                            and retroflexion views. Recommendation:           - Patient has a contact number available for                            emergencies. The signs and symptoms of potential                            delayed complications were discussed  with the                            patient. Return to normal activities tomorrow.                            Written discharge instructions were provided to the                            patient.                           - Clear liquids x 1 hour then soft foods rest of                            day. Start prior diet tomorrow.                           - Continue present medications.                           - Repeat colonoscopy is recommended. The                            colonoscopy date will be determined after pathology  results from today's exam become available for                            review. Gatha Mayer, MD 08/23/2016 2:41:46 PM This report has been signed electronically.

## 2016-08-23 NOTE — Patient Instructions (Addendum)
I stretched the esophagus, biopsied a polyp in the stomach and also took biopsies of the stomach - gastritis.  I removed 3 polyps from the colon - all small and all look benign.  I will let you know pathology results and when to have another routine colonoscopy by mail and/or My Chart.  Let's see if this helps the swallowing. We can check with you when the pathology results return.  I appreciate the opportunity to care for you. Gatha Mayer, MD, FACG  YOU HAD AN ENDOSCOPIC PROCEDURE TODAY AT [facility]:   Refer to the procedure report that was given to you for any specific questions about what was found during the examination.  If the procedure report does not answer your questions, please call your gastroenterologist to clarify.  If you requested that your care partner not be given the details of your procedure findings, then the procedure report has been included in a sealed envelope for you to review at your convenience later.  YOU SHOULD EXPECT: Some feelings of bloating in the abdomen. Passage of more gas than usual.  Walking can help get rid of the air that was put into your GI tract during the procedure and reduce the bloating. If you had a lower endoscopy (such as a colonoscopy or flexible sigmoidoscopy) you may notice spotting of blood in your stool or on the toilet paper. If you underwent a bowel prep for your procedure, you may not have a normal bowel movement for a few days.  Please Note:  You might notice some irritation and congestion in your nose or some drainage.  This is from the oxygen used during your procedure.  There is no need for concern and it should clear up in a day or so.  SYMPTOMS TO REPORT IMMEDIATELY:   Following lower endoscopy (colonoscopy or flexible sigmoidoscopy):  Excessive amounts of blood in the stool  Significant tenderness or worsening of abdominal pains  Swelling of the abdomen that is new, acute  Fever of 100F or  higher   Following upper endoscopy (EGD)  Vomiting of blood or coffee ground material  New chest pain or pain under the shoulder blades  Painful or persistently difficult swallowing  New shortness of breath  Fever of 100F or higher  Black, tarry-looking stools  For urgent or emergent issues, a gastroenterologist can be reached at any hour by calling [phone].   DIET:  We do recommend a small meal at first, but then you may proceed to your regular diet.  Drink plenty of fluids but you should avoid alcoholic beverages for 24 hours.  ACTIVITY:  You should plan to take it easy for the rest of today and you should NOT DRIVE or use heavy machinery until tomorrow (because of the sedation medicines used during the test).    FOLLOW UP: Our staff will call the number listed on your records the next business day following your procedure to check on you and address any questions or concerns that you may have regarding the information given to you following your procedure. If we do not reach you, we will leave a message.  However, if you are feeling well and you are not experiencing any problems, there is no need to return our call.  We will assume that you have returned to your regular daily activities without incident.  If any biopsies were taken you will be contacted by phone or by letter within the next 1-3 weeks.  Please  call us at [phone] if you have not heard about the biopsies in 3 weeks.    SIGNATURES/CONFIDENTIALITY: You and/or your care partner have signed paperwork which will be entered into your electronic medical record.  These signatures attest to the fact that that the information above on your After Visit Summary has been reviewed and is understood.  Full responsibility of the confidentiality of this discharge information lies with you and/or your care-partner.YOU HAD AN ENDOSCOPIC PROCEDURE TODAY AT [facility]:   Refer to the procedure report that was  given to you for any specific questions about what was found during the examination.  If the procedure report does not answer your questions, please call your gastroenterologist to clarify.  If you requested that your care partner not be given the details of your procedure findings, then the procedure report has been included in a sealed envelope for you to review at your convenience later.  YOU SHOULD EXPECT: Some feelings of bloating in the abdomen. Passage of more gas than usual.  Walking can help get rid of the air that was put into your GI tract during the procedure and reduce the bloating. If you had a lower endoscopy (such as a colonoscopy or flexible sigmoidoscopy) you may notice spotting of blood in your stool or on the toilet paper. If you underwent a bowel prep for your procedure, you may not have a normal bowel movement for a few days.  Please Note:  You might notice some irritation and congestion in your nose or some drainage.  This is from the oxygen used during your procedure.  There is no need for concern and it should clear up in a day or so.  SYMPTOMS TO REPORT IMMEDIATELY:   Following lower endoscopy (colonoscopy or flexible sigmoidoscopy):  Excessive amounts of blood in the stool  Significant tenderness or worsening of abdominal pains  Swelling of the abdomen that is new, acute  Fever of 100F or higher   Following upper endoscopy (EGD)  Vomiting of blood or coffee ground material  New chest pain or pain under the shoulder blades  Painful or persistently difficult swallowing  New shortness of breath  Fever of 100F or higher  Black, tarry-looking stools  For urgent or emergent issues, a gastroenterologist can be reached at any hour by calling [phone].   DIET:  We do recommend a small meal at first, but then you may proceed to your regular diet.  Drink plenty of fluids but you should avoid alcoholic beverages for 24 hours.  ACTIVITY:  You should plan to take it  easy for the rest of today and you should NOT DRIVE or use heavy machinery until tomorrow (because of the sedation medicines used during the test).    FOLLOW UP: Our staff will call the number listed on your records the next business day following your procedure to check on you and address any questions or concerns that you may have regarding the information given to you following your procedure. If we do not reach you, we will leave a message.  However, if you are feeling well and you are not experiencing any problems, there is no need to return our call.  We will assume that you have returned to your regular daily activities without incident.  If any biopsies were taken you will be contacted by phone or by letter within the next 1-3 weeks.  Please call us at [phone] if you have not heard about the  biopsies in 3 weeks.    SIGNATURES/CONFIDENTIALITY: You and/or your care partner have signed paperwork which will be entered into your electronic medical record.  These signatures attest to the fact that that the information above on your After Visit Summary has been reviewed and is understood.  Full responsibility of the confidentiality of this discharge information lies with you and/or your care-partner.  Gastritis, and after dilation diet information given,  Polyp information given.

## 2016-08-24 ENCOUNTER — Telehealth: Payer: Self-pay

## 2016-08-24 NOTE — Telephone Encounter (Signed)
  Follow up Call-  Call back number 08/23/2016  Post procedure Call Back phone  # [phone]  Permission to leave phone message Yes  Some recent data might be hidden     Patient questions:  Do you have a fever, pain , or abdominal swelling? No. Pain Score  0 *  Have you tolerated food without any problems? Yes.    Have you been able to return to your normal activities? Yes.    Do you have any questions about your discharge instructions: Diet   No. Medications  No. Follow up visit  No.  Do you have questions or concerns about your Care? No.  Actions: * If pain score is 4 or above: No action needed, pain <4.

## 2016-08-30 ENCOUNTER — Encounter: Payer: Self-pay | Admitting: Internal Medicine

## 2016-08-30 DIAGNOSIS — Z86010 Personal history of colonic polyps: Secondary | ICD-10-CM

## 2016-08-30 DIAGNOSIS — K317 Polyp of stomach and duodenum: Secondary | ICD-10-CM

## 2016-08-30 DIAGNOSIS — Z860101 Personal history of adenomatous and serrated colon polyps: Secondary | ICD-10-CM

## 2016-08-30 HISTORY — DX: Personal history of colonic polyps: Z86.010

## 2016-08-30 HISTORY — DX: Polyp of stomach and duodenum: K31.7

## 2016-08-30 HISTORY — DX: Personal history of adenomatous and serrated colon polyps: Z86.0101

## 2016-08-30 NOTE — Progress Notes (Signed)
Spoke with patient and gave her results. She reports she is much better. No problems at this time.

## 2016-08-30 NOTE — Progress Notes (Signed)
Please call from office 1) gastric biopsies show non-specific inflammation and a benign hyperplastic gastric polyp 2) colon polyps benign but pre-cancerous 3) ask her how dysphagia is? Let me know 4) [facility] no letter but colon recall 5 yrs and EGD recall 5 yrs

## 2017-01-30 ENCOUNTER — Other Ambulatory Visit: Payer: Self-pay | Admitting: Family Medicine

## 2017-01-30 DIAGNOSIS — Z1231 Encounter for screening mammogram for malignant neoplasm of breast: Secondary | ICD-10-CM

## 2017-03-15 ENCOUNTER — Ambulatory Visit
Admission: RE | Admit: 2017-03-15 | Discharge: 2017-03-15 | Disposition: A | Discharge status: Home / Self Care | Payer: Medicare Other | Source: Ambulatory Visit | Attending: Family Medicine | Admitting: Family Medicine

## 2017-03-15 DIAGNOSIS — Z1231 Encounter for screening mammogram for malignant neoplasm of breast: Secondary | ICD-10-CM

## 2017-07-26 ENCOUNTER — Other Ambulatory Visit: Payer: Self-pay | Admitting: Obstetrics and Gynecology

## 2017-07-31 ENCOUNTER — Other Ambulatory Visit: Payer: Self-pay

## 2017-07-31 ENCOUNTER — Encounter (HOSPITAL_COMMUNITY)
Admission: RE | Admit: 2017-07-31 | Discharge: 2017-07-31 | Disposition: A | Discharge status: Home / Self Care | Payer: Medicare Other | Source: Ambulatory Visit | Attending: Obstetrics and Gynecology | Admitting: Obstetrics and Gynecology

## 2017-07-31 ENCOUNTER — Encounter (HOSPITAL_COMMUNITY): Payer: Self-pay

## 2017-07-31 DIAGNOSIS — Z01812 Encounter for preprocedural laboratory examination: Secondary | ICD-10-CM | POA: Insufficient documentation

## 2017-07-31 DIAGNOSIS — Z0183 Encounter for blood typing: Secondary | ICD-10-CM | POA: Diagnosis not present

## 2017-07-31 HISTORY — DX: Pneumonia, unspecified organism: J18.9

## 2017-07-31 HISTORY — DX: Unspecified abdominal hernia without obstruction or gangrene: K46.9

## 2017-07-31 LAB — CBC
HCT: 43.7 % (ref 36.0–46.0)
Hemoglobin: 14.2 g/dL (ref 12.0–15.0)
MCH: 30.6 pg (ref 26.0–34.0)
MCHC: 32.5 g/dL (ref 30.0–36.0)
MCV: 94.2 fL (ref 78.0–100.0)
PLATELETS: 255 10*3/uL (ref 150–400)
RBC: 4.64 MIL/uL (ref 3.87–5.11)
RDW: 14.2 % (ref 11.5–15.5)
WBC: 9.1 10*3/uL (ref 4.0–10.5)

## 2017-07-31 LAB — COMPREHENSIVE METABOLIC PANEL
ALT: 23 U/L (ref 0–44)
AST: 26 U/L (ref 15–41)
Albumin: 3.8 g/dL (ref 3.5–5.0)
Alkaline Phosphatase: 82 U/L (ref 38–126)
Anion gap: 9 (ref 5–15)
BUN: 15 mg/dL (ref 8–23)
CO2: 25 mmol/L (ref 22–32)
CREATININE: 1.03 mg/dL — AB (ref 0.44–1.00)
Calcium: 9.5 mg/dL (ref 8.9–10.3)
Chloride: 101 mmol/L (ref 98–111)
GFR calc non Af Amer: 54 mL/min — ABNORMAL LOW (ref >60)
Glucose, Bld: 97 mg/dL (ref 70–99)
Potassium: 4.6 mmol/L (ref 3.5–5.1)
SODIUM: 135 mmol/L (ref 135–145)
Total Bilirubin: 0.7 mg/dL (ref 0.3–1.2)
Total Protein: 7.7 g/dL (ref 6.5–8.1)

## 2017-07-31 LAB — ABO/RH: ABO/RH(D): O POS

## 2017-07-31 NOTE — Pre-Procedure Instructions (Signed)
Dr. Jenita Seashore viewed EKG. AWARE OF HISTORY. NO CLEARANCE NEEDED

## 2017-07-31 NOTE — Patient Instructions (Addendum)
Your procedure is scheduled on: Wednesday August 09, 2017 at 8:30 am  Enter through the Main Entrance of [facility] at: 7:00 am  Pick up the phone at the desk and dial [phone].  Call this number if you have problems the morning of surgery: [phone].  Remember: Do NOT eat food or drink any liquids after: Midnight on Tuesday July 16  Take these medicines the morning of surgery with a SIP OF WATER: Pepcid, Oxybutybnin  STOP ALL VITAMINS, SUPPLEMENTS, HERBAL MEDICATIONS NOW  DO NOT SMOKE DAY OF SURGERY  Do NOT wear jewelry (body piercing), metal hair clips/bobby pins, make-up, or nail polish. Do NOT wear lotions, powders, or perfumes.  You may wear deoderant. Do NOT shave for 48 hours prior to surgery. Do NOT bring valuables to the hospital. Contacts, dentures, or bridgework may not be worn into surgery. Leave suitcase in car.  After surgery it may be brought to your room.    For patients admitted to the hospital, checkout time is 11:00 AM the day of discharge.

## 2017-08-01 LAB — TYPE AND SCREEN
ABO/RH(D): O POS
ANTIBODY SCREEN: NEGATIVE

## 2017-08-06 DIAGNOSIS — Z1501 Genetic susceptibility to malignant neoplasm of breast: Secondary | ICD-10-CM

## 2017-08-06 HISTORY — DX: Genetic susceptibility to malignant neoplasm of breast: Z15.01

## 2017-08-08 ENCOUNTER — Other Ambulatory Visit: Payer: Self-pay | Admitting: Obstetrics and Gynecology

## 2017-08-08 ENCOUNTER — Encounter (HOSPITAL_COMMUNITY): Payer: Self-pay | Admitting: Anesthesiology

## 2017-08-08 NOTE — H&P (Signed)
Reason for Appointment  1. pre-op /BRCA 2 mutation    History of Present Illness  General:  72 y/o with BRCA 2 mutation preseints for preop visit in preparation for laparotomy with bilateral oophorectomy. she desires to have the surgery performed to increased r/o ovarian cancer associated with BRCA 2 mutation. This is being performed by laparotomy due to the following history. h/o laparoscopic cholecsytectomy 30 years ago . 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 she has multiple abdominal scars upper and mid abdomen.  Her PCP is Dr. Ival Bible in [place]. she has a visit to obtain preop surgical clearance from Dr. Dion Saucier on July 9th.   Current Medications  Taking   Pravastatin Sodium 80 MG Tablet 1 tablet Orally Once a day   Famotidine 20 MG Tablet 1 tablet at bedtime Orally twice a day   Irbesartan 150 MG Tablet 1/2 tablet Orally Once a day   Rosuvastatin Calcium 40 MG Tablet 1 tablet Orally Once a day   Claritin(Loratadine) 10 MG Tablet 1 tablet Orally Once a day   Not-Taking   Aspir-81 81 MG Tablet Delayed Release 1 tablet Orally Once a day   Oxybutynin Chloride 10 MG Tablet Extended Release 24 Hour 1 tablet Orally twice a day   Trilipix(Choline Fenofibrate) 135 MG Capsule Delayed Release 1 capsule Orally qod   Zetia(Ezetimibe) 10 MG Tablet 1 tablet Orally Once a day   Vitamin D 5000 IU Tablet 1 tablet Orally Once a day   Pantoprazole Sodium 40 MG Tablet Delayed Release 1 tablet Orally Once a day   Medication List reviewed and reconciled with the patient    Past Medical History  Hiatal hernia.   Bladder incontinence.   Hyperlipidemia.   CVA-20 years ago .   Hypercholesterolemia.   h/o DVT 45 years ago .    Surgical History  cholecystectomy   esophageal dilation - Dr. Chalmers Cater 1999  colonoscopy   hysterectomy vaginal due to menorrhagia at the age of 78    Family History  Father: deceased  Mother: deceased,  diagnosed with Breast cancer  Children: alive, Breast cancer  Maternal aunt: Breast cancer  2 brother(s) , 2 sister(s) . 2daughter(s) .   Neg GI family history \nDaughter - Breast Cancer.   Social History  General:  no EXPOSURE TO PASSIVE SMOKE. no Alcohol. Children: 2, daughter (s). Tobacco use cigarettes: Never smoked, Tobacco history last updated 07/24/2017. Marital Status: married. no Recreational drug use. OCCUPATION: retired.    Gyn History  Sexual activity not currently sexually active.  Periods : hysterectomy.  Last pap smear date 1 year ago.  Last mammogram date 02/2017-normal.    OB History  Number of pregnancies 1.  Pregnancy # 1 live birth, vaginal delivery, twins .    Allergies  N.K.D.A.   Hospitalization/Major Diagnostic Procedure  No Hospitalization History.   Review of Systems  CONSTITUTIONAL:  Chills No. Fatigue No. Fever No. Night sweats No. Recent travel outside [place] No. Sweats No. Weight change No.  OPHTHALMOLOGY:  Blurring of vision no. Change in vision no. Double vision no.  ENT:  Dizziness no. Nose bleeds no. Sore throat no. Teeth pain no.  ALLERGY:  Hives no.  CARDIOLOGY:  Chest pain no. High blood pressure no. Irregular heart beat no. Leg edema no. Palpitations no.  RESPIRATORY:  Shortness of breath no. Cough no. Wheezing no.  UROLOGY:  Pain with urination no. Urinary urgency no. Urinary frequency no. Urinary incontinence no. Difficulty  urinating No. Blood in urine No.  GASTROENTEROLOGY:  Abdominal pain no. Appetite change no. Bloating/belching no. Blood in stool or on toilet paper no. Change in bowel movements no. Constipation no. Diarrhea no. Difficulty swallowing no. Nausea no.  FEMALE REPRODUCTIVE:  Vulvar pain no. Vulvar rash no. Abnormal vaginal bleeding no. Breast pain no. Nipple discharge no. Pain with intercourse no. Pelvic pain no. Unusual vaginal discharge no. Vaginal itching no.  MUSCULOSKELETAL:  Muscle aches no.  NEUROLOGY:  Headache  no. Tingling/numbness no. Weakness no.  PSYCHOLOGY:  Depression no. Anxiety no. Nervousness no. Sleep disturbances no. Suicidal ideation no .  ENDOCRINOLOGY:  Excessive thirst no. Excessive urination no. Hair loss no. Heat or cold intolerance no.  HEMATOLOGY/LYMPH:  Abnormal bleeding no. Easy bruising no. Swollen glands no.  DERMATOLOGY:  New/changing skin lesion no. Rash no. Sores no.        Vital Signs  Wt 169, Ht 62, BMI 30.91, Pulse sitting 68, BP sitting 124/74.   Examination  General Examination: CONSTITUTIONAL: alert, oriented, NAD . SKIN: moist, warm. EYES: Conjunctiva clear. LUNGS: clear to auscultation bilaterally. HEART: regular rate and rhythm. ABDOMEN: soft, non-tender/non-distended, bowel sounds present . FEMALE GENITOURINARY: normal external genitalia, labia - unremarkable, vagina - atrophic, adnexa - no masses or tenderness. PSYCH: affect normal, good eye contact.     Assessments   1. 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 the abdominal approach is recommended. d/t concern for pelvic adhesive disease. / r/b/a of surgery were reviewed with Kristie Anderson included but not limited to infection/ bleeding/ damage to bowel bladder / ureters with the need for futher surgery. She voiced understanding and desires to proceed.

## 2017-08-08 NOTE — Anesthesia Preprocedure Evaluation (Addendum)
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 on medications Normal cardiovascular exam Rhythm:Regular Rate:Normal     Neuro/Psych Left arm and right leg weakness CVA, Residual Symptoms    GI/Hepatic Neg liver ROS, hiatal hernia, GERD  Medicated and Controlled,Hiatal hernia S/P Nissen fundoplication complicated by esophageal perforation   Endo/Other  Obesity Hyperlipidemia  Renal/GU Renal InsufficiencyRenal disease Bladder dysfunction  SUI    Musculoskeletal  (+) Arthritis , Osteoarthritis,    Abdominal (+) + obese,   Peds  Hematology  (+) anemia ,   Anesthesia Other Findings   Reproductive/Obstetrics Genetic susceptibility to Ovarian Ca                          Anesthesia Physical Anesthesia Plan  ASA: III  Anesthesia Plan: General   Post-op Pain Management:    Induction: Intravenous  PONV Risk Score and Plan: 4 or greater and Midazolam, Ondansetron, Dexamethasone and Treatment may vary due to age or medical condition  Airway Management Planned: Oral ETT and Video Laryngoscope Planned  Additional Equipment:   Intra-op Plan:   Post-operative Plan: Extubation in OR  Informed Consent: I have reviewed the patients History and Physical, chart, labs and discussed the procedure including the risks, benefits and alternatives for the proposed anesthesia with the patient or authorized representative who has indicated his/her understanding and acceptance.    Dental advisory given  Plan Discussed with: CRNA and Surgeon  Anesthesia Plan Comments: (Glidescope as needed.)      Anesthesia Quick Evaluation

## 2017-08-09 ENCOUNTER — Inpatient Hospital Stay (HOSPITAL_COMMUNITY): Payer: Medicare Other | Admitting: Anesthesiology

## 2017-08-09 ENCOUNTER — Inpatient Hospital Stay (HOSPITAL_COMMUNITY)
Admission: AD | Admit: 2017-08-09 | Discharge: 2017-08-10 | DRG: 941 | Disposition: A | Discharge status: Home / Self Care | Payer: Medicare Other | Attending: Obstetrics and Gynecology | Admitting: Obstetrics and Gynecology

## 2017-08-09 ENCOUNTER — Other Ambulatory Visit: Payer: Self-pay

## 2017-08-09 ENCOUNTER — Encounter (HOSPITAL_COMMUNITY): Payer: Self-pay | Admitting: Emergency Medicine

## 2017-08-09 ENCOUNTER — Encounter (HOSPITAL_COMMUNITY)
Admission: AD | Disposition: A | Discharge status: Home / Self Care | Payer: Self-pay | Source: Home / Self Care | Attending: Obstetrics and Gynecology

## 2017-08-09 DIAGNOSIS — Z90722 Acquired absence of ovaries, bilateral: Secondary | ICD-10-CM

## 2017-08-09 DIAGNOSIS — Z803 Family history of malignant neoplasm of breast: Secondary | ICD-10-CM

## 2017-08-09 DIAGNOSIS — Z1509 Genetic susceptibility to other malignant neoplasm: Secondary | ICD-10-CM | POA: Diagnosis not present

## 2017-08-09 DIAGNOSIS — Z90710 Acquired absence of both cervix and uterus: Secondary | ICD-10-CM

## 2017-08-09 DIAGNOSIS — Z1501 Genetic susceptibility to malignant neoplasm of breast: Secondary | ICD-10-CM

## 2017-08-09 DIAGNOSIS — Z1502 Genetic susceptibility to malignant neoplasm of ovary: Principal | ICD-10-CM

## 2017-08-09 HISTORY — DX: Acquired absence of ovaries, bilateral: Z90.722

## 2017-08-09 HISTORY — PX: LAPAROTOMY: SHX154

## 2017-08-09 SURGERY — LAPAROTOMY
Anesthesia: General | Site: Abdomen

## 2017-08-09 MED ORDER — LIDOCAINE HCL (CARDIAC) PF 100 MG/5ML IV SOSY
PREFILLED_SYRINGE | INTRAVENOUS | Status: DC | PRN
  Administered 2017-08-09: 60 mg via INTRAVENOUS

## 2017-08-09 MED ORDER — HYDROMORPHONE HCL 1 MG/ML IJ SOLN
INTRAMUSCULAR | Status: AC
  Filled 2017-08-09: qty 0.5

## 2017-08-09 MED ORDER — ONDANSETRON HCL 4 MG/2ML IJ SOLN
INTRAMUSCULAR | Status: AC
  Filled 2017-08-09: qty 2

## 2017-08-09 MED ORDER — FAMOTIDINE 20 MG PO TABS
20.0000 mg | ORAL_TABLET | Freq: Two times a day (BID) | ORAL | Status: DC
  Administered 2017-08-09 – 2017-08-10 (×2): 20 mg via ORAL
  Filled 2017-08-09 (×2): qty 1

## 2017-08-09 MED ORDER — SUGAMMADEX SODIUM 200 MG/2ML IV SOLN
INTRAVENOUS | Status: AC
Start: 2017-08-09 — End: ?
  Filled 2017-08-09: qty 2

## 2017-08-09 MED ORDER — IRBESARTAN 75 MG PO TABS
75.0000 mg | ORAL_TABLET | Freq: Every day | ORAL | Status: DC
Start: 2017-08-09 — End: 2017-08-10
  Administered 2017-08-09: 75 mg via ORAL
  Filled 2017-08-09: qty 1

## 2017-08-09 MED ORDER — ONDANSETRON HCL 4 MG/2ML IJ SOLN
INTRAMUSCULAR | Status: DC | PRN
  Administered 2017-08-09: 4 mg via INTRAVENOUS

## 2017-08-09 MED ORDER — KETOROLAC TROMETHAMINE 30 MG/ML IJ SOLN: 30.0000 mg | Freq: Four times a day (QID) | INTRAMUSCULAR | Status: DC

## 2017-08-09 MED ORDER — MEPERIDINE HCL 25 MG/ML IJ SOLN: 6.2500 mg | INTRAMUSCULAR | Status: DC | PRN

## 2017-08-09 MED ORDER — DEXAMETHASONE SODIUM PHOSPHATE 10 MG/ML IJ SOLN
INTRAMUSCULAR | Status: DC | PRN
  Administered 2017-08-09: 10 mg via INTRAVENOUS

## 2017-08-09 MED ORDER — ROCURONIUM BROMIDE 100 MG/10ML IV SOLN
INTRAVENOUS | Status: DC | PRN
  Administered 2017-08-09: 40 mg via INTRAVENOUS

## 2017-08-09 MED ORDER — HYDROMORPHONE HCL 1 MG/ML IJ SOLN: 0.2000 mg | INTRAMUSCULAR | Status: DC | PRN

## 2017-08-09 MED ORDER — MIDAZOLAM HCL 2 MG/2ML IJ SOLN
INTRAMUSCULAR | Status: DC | PRN
  Administered 2017-08-09: 1 mg via INTRAVENOUS

## 2017-08-09 MED ORDER — BUPIVACAINE HCL (PF) 0.25 % IJ SOLN
INTRAMUSCULAR | Status: AC
  Filled 2017-08-09: qty 30

## 2017-08-09 MED ORDER — SIMETHICONE 80 MG PO CHEW: 80.0000 mg | CHEWABLE_TABLET | Freq: Four times a day (QID) | ORAL | Status: DC | PRN

## 2017-08-09 MED ORDER — LACTATED RINGERS IV SOLN
INTRAVENOUS | Status: DC
  Administered 2017-08-09 (×2): via INTRAVENOUS

## 2017-08-09 MED ORDER — LIDOCAINE HCL (CARDIAC) PF 100 MG/5ML IV SOSY
PREFILLED_SYRINGE | INTRAVENOUS | Status: AC
  Filled 2017-08-09: qty 5

## 2017-08-09 MED ORDER — PROPOFOL 10 MG/ML IV BOLUS
INTRAVENOUS | Status: DC | PRN
  Administered 2017-08-09: 150 mg via INTRAVENOUS

## 2017-08-09 MED ORDER — DEXAMETHASONE SODIUM PHOSPHATE 10 MG/ML IJ SOLN
INTRAMUSCULAR | Status: AC
  Filled 2017-08-09: qty 1

## 2017-08-09 MED ORDER — FENTANYL CITRATE (PF) 100 MCG/2ML IJ SOLN
INTRAMUSCULAR | Status: AC
  Filled 2017-08-09: qty 2

## 2017-08-09 MED ORDER — SUGAMMADEX SODIUM 200 MG/2ML IV SOLN
INTRAVENOUS | Status: DC | PRN
  Administered 2017-08-09: 151.6 mg via INTRAVENOUS

## 2017-08-09 MED ORDER — GLYCOPYRROLATE 0.2 MG/ML IJ SOLN
INTRAMUSCULAR | Status: DC | PRN
  Administered 2017-08-09: 0.1 mg via INTRAVENOUS

## 2017-08-09 MED ORDER — ONDANSETRON HCL 4 MG/2ML IJ SOLN: 4.0000 mg | Freq: Four times a day (QID) | INTRAMUSCULAR | Status: DC | PRN

## 2017-08-09 MED ORDER — CEFAZOLIN SODIUM-DEXTROSE 2-4 GM/100ML-% IV SOLN
2.0000 g | INTRAVENOUS | Status: AC
  Administered 2017-08-09: 2 g via INTRAVENOUS

## 2017-08-09 MED ORDER — EPHEDRINE SULFATE 50 MG/ML IJ SOLN
INTRAMUSCULAR | Status: DC | PRN
  Administered 2017-08-09: 5 mg via INTRAVENOUS

## 2017-08-09 MED ORDER — OXYCODONE-ACETAMINOPHEN 5-325 MG PO TABS: 2.0000 | ORAL_TABLET | ORAL | Status: DC | PRN

## 2017-08-09 MED ORDER — MIDAZOLAM HCL 2 MG/2ML IJ SOLN
INTRAMUSCULAR | Status: AC
  Filled 2017-08-09: qty 2

## 2017-08-09 MED ORDER — ROCURONIUM BROMIDE 100 MG/10ML IV SOLN
INTRAVENOUS | Status: AC
  Filled 2017-08-09: qty 1

## 2017-08-09 MED ORDER — MENTHOL 3 MG MT LOZG: 1.0000 | LOZENGE | OROMUCOSAL | Status: DC | PRN

## 2017-08-09 MED ORDER — FENTANYL CITRATE (PF) 100 MCG/2ML IJ SOLN
INTRAMUSCULAR | Status: DC | PRN
  Administered 2017-08-09 (×3): 100 ug via INTRAVENOUS

## 2017-08-09 MED ORDER — HYDROMORPHONE HCL 1 MG/ML IJ SOLN
0.2500 mg | INTRAMUSCULAR | Status: DC | PRN
  Administered 2017-08-09: 0.5 mg via INTRAVENOUS

## 2017-08-09 MED ORDER — ALUM & MAG HYDROXIDE-SIMETH 200-200-20 MG/5ML PO SUSP: 30.0000 mL | ORAL | Status: DC | PRN

## 2017-08-09 MED ORDER — SENNA 8.6 MG PO TABS
1.0000 | ORAL_TABLET | Freq: Two times a day (BID) | ORAL | Status: DC
  Administered 2017-08-09 – 2017-08-10 (×2): 8.6 mg via ORAL
  Filled 2017-08-09 (×5): qty 1

## 2017-08-09 MED ORDER — OXYCODONE-ACETAMINOPHEN 5-325 MG PO TABS: 1.0000 | ORAL_TABLET | ORAL | Status: DC | PRN

## 2017-08-09 MED ORDER — LACTATED RINGERS IV SOLN
INTRAVENOUS | Status: DC
  Administered 2017-08-09 – 2017-08-10 (×2): via INTRAVENOUS
  Administered 2017-08-10: 125 mL/h via INTRAVENOUS

## 2017-08-09 MED ORDER — LORATADINE 10 MG PO TABS
10.0000 mg | ORAL_TABLET | Freq: Every day | ORAL | Status: DC
  Filled 2017-08-09 (×3): qty 1

## 2017-08-09 MED ORDER — PROPOFOL 10 MG/ML IV BOLUS
INTRAVENOUS | Status: AC
  Filled 2017-08-09: qty 20

## 2017-08-09 MED ORDER — IBUPROFEN 600 MG PO TABS
600.0000 mg | ORAL_TABLET | Freq: Four times a day (QID) | ORAL | Status: DC | PRN
  Administered 2017-08-10: 600 mg via ORAL
  Filled 2017-08-09: qty 1

## 2017-08-09 MED ORDER — FENTANYL CITRATE (PF) 250 MCG/5ML IJ SOLN
INTRAMUSCULAR | Status: AC
  Filled 2017-08-09: qty 5

## 2017-08-09 MED ORDER — HYDROMORPHONE HCL 1 MG/ML IJ SOLN
INTRAMUSCULAR | Status: DC | PRN
  Administered 2017-08-09: 0.5 mg via INTRAVENOUS

## 2017-08-09 MED ORDER — BUPIVACAINE HCL (PF) 0.25 % IJ SOLN
INTRAMUSCULAR | Status: DC | PRN
  Administered 2017-08-09: 30 mL

## 2017-08-09 MED ORDER — KETOROLAC TROMETHAMINE 30 MG/ML IJ SOLN
15.0000 mg | Freq: Four times a day (QID) | INTRAMUSCULAR | Status: DC
  Administered 2017-08-09 – 2017-08-10 (×4): 15 mg via INTRAVENOUS
  Filled 2017-08-09 (×4): qty 1

## 2017-08-09 MED ORDER — KETOROLAC TROMETHAMINE 30 MG/ML IJ SOLN: 15.0000 mg | Freq: Four times a day (QID) | INTRAMUSCULAR | Status: DC

## 2017-08-09 MED ORDER — ONDANSETRON HCL 4 MG PO TABS: 4.0000 mg | ORAL_TABLET | Freq: Four times a day (QID) | ORAL | Status: DC | PRN

## 2017-08-09 MED ORDER — HYDROMORPHONE HCL 1 MG/ML IJ SOLN
INTRAMUSCULAR | Status: AC
  Filled 2017-08-09: qty 1

## 2017-08-09 MED ORDER — PHENYLEPHRINE HCL 10 MG/ML IJ SOLN
INTRAMUSCULAR | Status: DC | PRN
  Administered 2017-08-09: 80 ug via INTRAVENOUS

## 2017-08-09 MED ORDER — ONDANSETRON HCL 4 MG/2ML IJ SOLN: 4.0000 mg | Freq: Once | INTRAMUSCULAR | Status: DC | PRN

## 2017-08-09 MED ORDER — EPHEDRINE 5 MG/ML INJ
INTRAVENOUS | Status: AC
  Filled 2017-08-09: qty 10

## 2017-08-09 MED ORDER — PHENYLEPHRINE 40 MCG/ML (10ML) SYRINGE FOR IV PUSH (FOR BLOOD PRESSURE SUPPORT)
PREFILLED_SYRINGE | INTRAVENOUS | Status: AC
  Filled 2017-08-09: qty 10

## 2017-08-09 SURGICAL SUPPLY — 64 items
APL SKNCLS STERI-STRIP NONHPOA (GAUZE/BANDAGES/DRESSINGS) ×2
BAG SPEC RTRVL LRG 6X4 10 (ENDOMECHANICALS)
BENZOIN TINCTURE PRP APPL 2/3 (GAUZE/BANDAGES/DRESSINGS) ×3 IMPLANT
BLADE SURG 10 STRL SS (BLADE) ×2 IMPLANT
CABLE HIGH FREQUENCY MONO STRZ (ELECTRODE) IMPLANT
CANISTER SUCT 3000ML PPV (MISCELLANEOUS) ×3 IMPLANT
CELLS DAT CNTRL 66122 CELL SVR (MISCELLANEOUS) ×2 IMPLANT
CLIP LIGATING EXTRA MED SLVR (CLIP) IMPLANT
CONT PATH 16OZ SNAP LID 3702 (MISCELLANEOUS) ×3 IMPLANT
DISSECTOR ROUND CHERRY 3/8 STR (MISCELLANEOUS) ×2 IMPLANT
DRAPE WARM FLUID 44X44 (DRAPE) IMPLANT
DRSG OPSITE POSTOP 3X4 (GAUZE/BANDAGES/DRESSINGS) IMPLANT
DRSG OPSITE POSTOP 4X10 (GAUZE/BANDAGES/DRESSINGS) ×2 IMPLANT
DURAPREP 26ML APPLICATOR (WOUND CARE) ×3 IMPLANT
GAUZE SPONGE 4X4 16PLY XRAY LF (GAUZE/BANDAGES/DRESSINGS) IMPLANT
GLOVE BIOGEL M 6.5 STRL (GLOVE) ×6 IMPLANT
GLOVE BIOGEL PI IND STRL 6.5 (GLOVE) ×2 IMPLANT
GLOVE BIOGEL PI IND STRL 7.0 (GLOVE) ×2 IMPLANT
GLOVE BIOGEL PI INDICATOR 6.5 (GLOVE) ×1
GLOVE BIOGEL PI INDICATOR 7.0 (GLOVE) ×1
GOWN STRL REUS W/TWL LRG LVL3 (GOWN DISPOSABLE) ×6 IMPLANT
HEMOSTAT ARISTA ABSORB 3G PWDR (MISCELLANEOUS) ×2 IMPLANT
NEEDLE HYPO 22GX1.5 SAFETY (NEEDLE) ×2 IMPLANT
NS IRRIG 1000ML POUR BTL (IV SOLUTION) ×3 IMPLANT
PACK ABDOMINAL GYN (CUSTOM PROCEDURE TRAY) ×3 IMPLANT
PACK LAPAROSCOPY BASIN (CUSTOM PROCEDURE TRAY) ×1 IMPLANT
PACK TRENDGUARD 450 HYBRID PRO (MISCELLANEOUS) IMPLANT
PAD OB MATERNITY 4.3X12.25 (PERSONAL CARE ITEMS) ×3 IMPLANT
PENCIL SMOKE EVAC W/HOLSTER (ELECTROSURGICAL) ×1 IMPLANT
POUCH SPECIMEN RETRIEVAL 10MM (ENDOMECHANICALS) IMPLANT
PROTECTOR NERVE ULNAR (MISCELLANEOUS) ×3 IMPLANT
RETRACTOR WND ALEXIS 18 MED (MISCELLANEOUS) IMPLANT
RTRCTR WOUND ALEXIS 18CM MED (MISCELLANEOUS) ×3
SET IRRIG TUBING LAPAROSCOPIC (IRRIGATION / IRRIGATOR) IMPLANT
SHEARS HARMONIC ACE PLUS 36CM (ENDOMECHANICALS) IMPLANT
SOLUTION ELECTROLUBE (MISCELLANEOUS) IMPLANT
SPONGE LAP 18X18 X RAY DECT (DISPOSABLE) IMPLANT
STAPLER VISISTAT 35W (STAPLE) IMPLANT
STRIP CLOSURE SKIN 1/2X4 (GAUZE/BANDAGES/DRESSINGS) ×2 IMPLANT
SUT PDS AB 0 CT1 27 (SUTURE) ×8 IMPLANT
SUT PDS AB 1 CTX 36 (SUTURE) IMPLANT
SUT PLAIN 2 0 (SUTURE) ×3
SUT PLAIN ABS 2-0 CT1 27XMFL (SUTURE) ×1 IMPLANT
SUT VIC AB 0 CT1 18XCR BRD8 (SUTURE) ×4 IMPLANT
SUT VIC AB 0 CT1 36 (SUTURE) IMPLANT
SUT VIC AB 0 CT1 8-18 (SUTURE) ×6
SUT VIC AB 2-0 CT1 (SUTURE) IMPLANT
SUT VIC AB 2-0 CT1 27 (SUTURE) ×6
SUT VIC AB 2-0 CT1 TAPERPNT 27 (SUTURE) ×4 IMPLANT
SUT VIC AB 2-0 SH 27 (SUTURE)
SUT VIC AB 2-0 SH 27XBRD (SUTURE) IMPLANT
SUT VIC AB 4-0 KS 27 (SUTURE) ×3 IMPLANT
SUT VIC AB 4-0 PS2 27 (SUTURE) ×1 IMPLANT
SUT VICRYL 0 TIES 12 18 (SUTURE) IMPLANT
SUT VICRYL 0 UR6 27IN ABS (SUTURE) ×1 IMPLANT
SYRINGE CONTROL L 12CC (SYRINGE) ×3 IMPLANT
SYRINGE CONTROL LL 12CC (SYRINGE) IMPLANT
TOWEL OR 17X24 6PK STRL BLUE (TOWEL DISPOSABLE) ×6 IMPLANT
TRAY FOLEY W/BAG SLVR 14FR (SET/KITS/TRAYS/PACK) ×3 IMPLANT
TRENDGUARD 450 HYBRID PRO PACK (MISCELLANEOUS)
TROCAR XCEL NON-BLD 11X100MML (ENDOMECHANICALS) ×1 IMPLANT
TROCAR XCEL NON-BLD 5MMX100MML (ENDOMECHANICALS) ×1 IMPLANT
TUBING INSUF HEATED (TUBING) ×1 IMPLANT
WARMER LAPAROSCOPE (MISCELLANEOUS) ×1 IMPLANT

## 2017-08-09 NOTE — Anesthesia Postprocedure Evaluation (Signed)
Anesthesia Post Note  Patient: Kristie Anderson  Procedure(s) Performed: LAPAROTOMY WITH BILATERAL SALPINGOOPHERECTOMY (N/A Abdomen)     Patient location during evaluation: PACU Anesthesia Type: General Level of consciousness: awake and alert Pain management: pain level controlled Vital Signs Assessment: post-procedure vital signs reviewed and stable Respiratory status: spontaneous breathing, nonlabored ventilation and respiratory function stable Cardiovascular status: blood pressure returned to baseline and stable Postop Assessment: no apparent nausea or vomiting Anesthetic complications: no    Last Vitals:  Vitals:   08/09/17 1058 08/09/17 1118  BP: (!) 145/63   Pulse: 76 79  Resp: 13 18  Temp:  (!) 36.3 C  SpO2: 100% 100%    Last Pain:  Vitals:   08/09/17 1118  TempSrc: Oral  PainSc:    Pain Goal: Patients Stated Pain Goal: 4 (08/09/17 0710)               Henrry Feil A.

## 2017-08-09 NOTE — Anesthesia Postprocedure Evaluation (Signed)
Anesthesia Post Note  Patient: [name]  Procedure(s) Performed: LAPAROTOMY WITH BILATERAL SALPINGOOPHERECTOMY (N/A Abdomen)     Patient location during evaluation: [facility] Anesthesia Type: General Level of consciousness: awake, awake and alert and oriented Pain management: pain level controlled Vital Signs Assessment: post-procedure vital signs reviewed and stable Respiratory status: spontaneous breathing, nonlabored ventilation and respiratory function stable Cardiovascular status: stable Postop Assessment: no headache, no backache, no apparent nausea or vomiting and adequate PO intake Anesthetic complications: no    Last Vitals:  Vitals:   08/09/17 1118 08/09/17 1224  BP:  127/60  Pulse: 79 89  Resp: 18 18  Temp: (!) 36.3 C 36.4 C  SpO2: 100% 100%    Last Pain:  Vitals:   08/09/17 1316  TempSrc:   PainSc: 4    Pain Goal: Patients Stated Pain Goal: 4 (08/09/17 1118)               Shaquon Gropp

## 2017-08-09 NOTE — H&P (Signed)
Date of Initial H&P:08/08/2017 History reviewed, patient examined, no change in status, stable for surgery.

## 2017-08-09 NOTE — Op Note (Signed)
08/09/2017  2:22 PM  PATIENT:  Kristie Anderson  72 y.o. female  PRE-OPERATIVE DIAGNOSIS:  Z15.09 Genetic susceptibility to other malignant neoplasm  POST-OPERATIVE DIAGNOSIS:  Z15.09 Genetic susceptibility to other malignant neoplasm 2. Pelvic adhesive disease  PROCEDURE:  Procedure(s): LAPAROTOMY WITH BILATERAL SALPINGOOPHERECTOMY (N/A)  SURGEON:  Surgeon(s) and Role:    Christophe Louis, MD - Primary    * Janyth Pupa, DO - Assisting  PHYSICIAN ASSISTANT:   ASSISTANTS: none   ANESTHESIA:   none  EBL:  50 mL   BLOOD ADMINISTERED:none  DRAINS: Urinary Catheter (Foley)   LOCAL MEDICATIONS USED:  MARCAINE     SPECIMEN:  Source of Specimen:  bilateral fallopian tubes and ovaries   DISPOSITION OF SPECIMEN:  PATHOLOGY  COUNTS:  YES  TOURNIQUET:  * No tourniquets in log *  DICTATION: .Dragon Dictation  PLAN OF CARE: Admit to inpatient   PATIENT DISPOSITION:  PACU - hemodynamically stable.   Delay start of Pharmacological VTE agent (>24hrs) due to surgical blood loss or risk of bleeding: not applicable  Findings. Normal appearing fallopian tubes and ovaries bilaterally. Adhesions of the Colon to the Left abdominal side wall.   Procedure Details  The patient was seen in the Holding Room. The risks, benefits, complications, treatment options, and expected outcomes were discussed with the patient.  The patient concurred with the proposed plan, giving informed consent.  The site of surgery properly noted/marked. The patient was taken to Operating Room # 4, identified as Kristie Anderson and the procedure verified as laparotomy with bilateral salpingoophorectomy . A Time Out was held and the above information confirmed.  After induction of anesthesia,  Pt was placed in supine position after anesthesia and draped and prepped in the usual sterile manner. Foley catheter was placed.  A pfannensteil  incision was made and carried through the subcutaneous tissue to the fascia. Fascial  incision was made and extended laterally . The rectus muscles were separated. The peritoneum was identified and entered. Peritoneal incision was extended longitudinally.  The above findings were noted. Alexis  retractor was placed and bowel was packed away from the surgical site.   The right infundibulo-pelvic ligament was grasped, cut, and suture ligated with 0-Vicryl. The left infundibulo-pelvic ligament was grasped, cut and suture ligated with 0-Vicryl. Arista was placed along the right and left Infunibulo-pelvic pedicles. Excellent hemostasis was noted.   Retractor and all packing was removed from the abdomen. The fascia was approximated with running sutures of 0 pds. The subcutaneous layer was closed with 2-0 chromic. The subcuticular layer was closed with 4-0 vicryl.   Sponge, lap and needle counts were correct x 2.  Pt was awakened from anesthesia and taken to the recovery room in stable condition.

## 2017-08-09 NOTE — Transfer of Care (Signed)
Immediate Anesthesia Transfer of Care Note  Patient: Kristie Anderson  Procedure(s) Performed: LAPAROTOMY WITH BILATERAL SALPINGOOPHERECTOMY (N/A Abdomen)  Patient Location: PACU  Anesthesia Type:General  Level of Consciousness: awake, alert  and oriented  Airway & Oxygen Therapy: Patient Spontanous Breathing  Post-op Assessment: Report given to RN and Post -op Vital signs reviewed and stable  Post vital signs: Reviewed and stable  Last Vitals:  Vitals Value Taken Time  BP 138/53 08/09/2017 10:06 AM  Temp    Pulse 85 08/09/2017 10:11 AM  Resp 14 08/09/2017 10:11 AM  SpO2 100 % 08/09/2017 10:11 AM  Vitals shown include unvalidated device data.  Last Pain:  Vitals:   08/09/17 0710  TempSrc: Oral      Patients Stated Pain Goal: 4 (48/25/00 3704)  Complications: No apparent anesthesia complications

## 2017-08-09 NOTE — Anesthesia Procedure Notes (Signed)
Procedure Name: Intubation Date/Time: 08/09/2017 8:39 AM Performed by: Genevie Ann, CRNA Pre-anesthesia Checklist: Patient identified, Emergency Drugs available, Suction available, Patient being monitored and Timeout performed Patient Re-evaluated:Patient Re-evaluated prior to induction Oxygen Delivery Method: Circle system utilized Preoxygenation: Pre-oxygenation with 100% oxygen Induction Type: IV induction Ventilation: Mask ventilation with difficulty Laryngoscope Size: Mac and 4 Grade View: Grade II Tube type: Oral Tube size: 7.0 mm Number of attempts: 1 Placement Confirmation: ETT inserted through vocal cords under direct vision,  positive ETCO2 and breath sounds checked- equal and bilateral Secured at: 21 cm Dental Injury: Teeth and Oropharynx as per pre-operative assessment

## 2017-08-10 ENCOUNTER — Encounter (HOSPITAL_COMMUNITY): Payer: Self-pay | Admitting: Obstetrics and Gynecology

## 2017-08-10 LAB — CBC
HCT: 34.6 % — ABNORMAL LOW (ref 36.0–46.0)
Hemoglobin: 11.4 g/dL — ABNORMAL LOW (ref 12.0–15.0)
MCH: 30.9 pg (ref 26.0–34.0)
MCHC: 32.9 g/dL (ref 30.0–36.0)
MCV: 93.8 fL (ref 78.0–100.0)
PLATELETS: 181 10*3/uL (ref 150–400)
RBC: 3.69 MIL/uL — ABNORMAL LOW (ref 3.87–5.11)
RDW: 14.4 % (ref 11.5–15.5)
WBC: 13.9 10*3/uL — ABNORMAL HIGH (ref 4.0–10.5)

## 2017-08-10 MED ORDER — OXYCODONE HCL 5 MG PO TABS: 5.0000 mg | ORAL_TABLET | ORAL | 0 refills | Status: DC | PRN

## 2017-08-10 MED ORDER — ACETAMINOPHEN 500 MG PO TABS: 1000.0000 mg | ORAL_TABLET | Freq: Three times a day (TID) | ORAL | 0 refills | Status: AC | PRN

## 2017-08-10 NOTE — Discharge Summary (Signed)
Physician Discharge Summary  Patient ID: Kristie Anderson MRN: [id number] DOB/AGE: [birth year] 70 y.o.  Admit date: 08/09/2017 Discharge date: 08/10/2017  Admission Diagnoses:  Discharge Diagnoses:  Active Problems:   S/P bilateral oophorectomy   Discharged Condition: good  Hospital Course: Post op course uncomplicated.  Consults: None  Significant Diagnostic Studies: labs:  CBC    Component Value Date/Time   WBC 13.9 (H) 08/10/2017 0600   RBC 3.69 (L) 08/10/2017 0600   HGB 11.4 (L) 08/10/2017 0600   HCT 34.6 (L) 08/10/2017 0600   PLT 181 08/10/2017 0600   MCV 93.8 08/10/2017 0600   MCH 30.9 08/10/2017 0600   MCHC 32.9 08/10/2017 0600   RDW 14.4 08/10/2017 0600   LYMPHSABS 0.9 02/28/2014 1154   MONOABS 0.9 02/28/2014 1154   EOSABS 0.1 02/28/2014 1154   BASOSABS 0.1 02/28/2014 1154    Treatments: surgery: As above  Discharge Exam: Blood pressure 118/65, pulse 68, temperature (!) 97.5 F (36.4 C), temperature source Oral, resp. rate 18, height 5\' 2"  (1.575 m), weight 75.8 kg (167 lb), SpO2 100 %. Gen:  NAD, A&O x 3 Abd:  Incision dressing clean and dry, soft, nondistended, appropriately tender Ext:  No calf tenderness  Disposition: Discharge disposition: 01-Home or Self Care       Discharge Instructions     Remove dressing in 72 hours   Complete by:  As directed    Call MD for:  difficulty breathing, headache or visual disturbances   Complete by:  As directed    Call MD for:  extreme fatigue   Complete by:  As directed    Call MD for:  persistant nausea and vomiting   Complete by:  As directed    Call MD for:  redness, tenderness, or signs of infection (pain, swelling, redness, odor or green/yellow discharge around incision site)   Complete by:  As directed    Call MD for:  severe uncontrolled pain   Complete by:  As directed    Call MD for:  temperature >100.4   Complete by:  As directed    Diet - low sodium heart healthy   Complete by:  As directed     Increase activity slowly   Complete by:  As directed    Lifting restrictions   Complete by:  As directed    No heavy lifting/pushing/pulling greater than 15 pounds.      Follow-up Information    Christophe Louis, MD Follow up in 2 week(s).   Specialty:  Obstetrics and Gynecology Why:  Post op check up. Contact information: [address] [place] [address] [phone]           Signed: Thurnell Lose 08/10/2017, 2:07 PM

## 2017-08-10 NOTE — Progress Notes (Signed)
Teaching complete  Pt to ambulate out with husband

## 2017-08-10 NOTE — Discharge Instructions (Signed)
Exploratory Laparotomy, Adult, Care After °Refer to this sheet in the next few weeks. These instructions provide you with information about caring for yourself after your procedure. Your health care provider may also give you more specific instructions. Your treatment has been planned according to current medical practices, but problems sometimes occur. Call your health care provider if you have any problems or questions after your procedure. °What can I expect after the procedure? °After your procedure, it is typical to have: °· Abdominal soreness. °· Fatigue. °· A sore throat from tubes in your throat. °· A lack of appetite. ° °Follow these instructions at home: °Medicines °· Take medicines only as directed by your health care provider. °· Do not drive or operate heavy machinery while taking pain medicine. °Incision care °· There are many different ways to close and cover an incision, including stitches (sutures), skin glue, and adhesive strips. Follow your health care provider's instructions about: °? Incision care. °? Bandage (dressing) changes and removal. °? Incision closure removal. °· Do not take showers or baths until your health care provider says that you can. °· Check your incision area daily for signs of infection. Watch for: °? Redness. °? Tenderness. °? Swelling. °? Drainage. °Activity °· Do not lift anything that is heavier than 10 pounds (4.5 kg) until your health care provider says that it is safe. °· Try to walk a little bit each day if your health care provider says that it is okay. °· Ask your health care provider when you can start to do your usual activities again, such as driving, going back to work, and having sex. °Eating and drinking °· You may eat what you usually eat. Include lots of whole grains, fruits, and vegetables in your diet. This will help to prevent constipation. °· Drink enough fluid to keep your urine clear or pale yellow. °General instructions °· Keep all follow-up visits as  directed by your health care provider. This is important. °Contact a health care provider if: °· You have a fever. °· You have chills. °· Your pain medicine is not helping. °· You have constipation or diarrhea. °· You have nausea or vomiting. °· You have drainage, redness, swelling, or pain at your incision site. °Get help right away if: °· Your pain is getting worse. °· It has been more than 3 days since you been able to have a bowel movement. °· You have ongoing (persistent) vomiting. °· The edges of your incision open up. °· You have warmth, tenderness, and swelling in your calf. °· You have trouble breathing. °· You have chest pain. °This information is not intended to replace advice given to you by your health care provider. Make sure you discuss any questions you have with your health care provider. °Document Released: 08/25/2003 Document Revised: 06/18/2015 Document Reviewed: 08/28/2013 °Elsevier Interactive Patient Education © 2018 [employer] ° °

## 2017-10-25 DIAGNOSIS — N3281 Overactive bladder: Secondary | ICD-10-CM | POA: Insufficient documentation

## 2017-10-25 DIAGNOSIS — G4700 Insomnia, unspecified: Secondary | ICD-10-CM

## 2017-10-25 HISTORY — DX: Insomnia, unspecified: G47.00

## 2017-10-25 HISTORY — DX: Overactive bladder: N32.81

## 2018-02-19 ENCOUNTER — Other Ambulatory Visit: Payer: Self-pay | Admitting: Family Medicine

## 2018-02-19 DIAGNOSIS — E2839 Other primary ovarian failure: Secondary | ICD-10-CM

## 2018-02-19 DIAGNOSIS — M8580 Other specified disorders of bone density and structure, unspecified site: Secondary | ICD-10-CM

## 2018-02-19 DIAGNOSIS — Z1231 Encounter for screening mammogram for malignant neoplasm of breast: Secondary | ICD-10-CM

## 2018-02-20 ENCOUNTER — Other Ambulatory Visit: Payer: Self-pay | Admitting: Family Medicine

## 2018-02-20 DIAGNOSIS — R5381 Other malaise: Secondary | ICD-10-CM

## 2018-02-22 ENCOUNTER — Other Ambulatory Visit: Payer: Self-pay | Admitting: Family Medicine

## 2018-02-22 DIAGNOSIS — E2839 Other primary ovarian failure: Secondary | ICD-10-CM

## 2018-02-22 DIAGNOSIS — M8580 Other specified disorders of bone density and structure, unspecified site: Secondary | ICD-10-CM

## 2018-03-19 ENCOUNTER — Ambulatory Visit: Payer: Medicare Other

## 2018-04-16 ENCOUNTER — Other Ambulatory Visit: Payer: Medicare Other

## 2018-04-16 ENCOUNTER — Ambulatory Visit: Payer: Medicare Other

## 2018-05-16 ENCOUNTER — Ambulatory Visit: Payer: Medicare Other

## 2018-06-28 ENCOUNTER — Ambulatory Visit
Admission: RE | Admit: 2018-06-28 | Discharge: 2018-06-28 | Disposition: A | Discharge status: Home / Self Care | Payer: Medicare Other | Source: Ambulatory Visit | Attending: Family Medicine | Admitting: Family Medicine

## 2018-06-28 ENCOUNTER — Other Ambulatory Visit: Payer: Self-pay

## 2018-06-28 DIAGNOSIS — Z1231 Encounter for screening mammogram for malignant neoplasm of breast: Secondary | ICD-10-CM

## 2019-03-03 ENCOUNTER — Ambulatory Visit: Payer: Medicare PPO | Attending: Internal Medicine

## 2019-03-03 DIAGNOSIS — Z23 Encounter for immunization: Secondary | ICD-10-CM | POA: Insufficient documentation

## 2019-03-03 NOTE — Progress Notes (Signed)
   Covid-19 Vaccination Clinic  Name:  Kristie Anderson    MRN: [id number] DOB: 10/16/1945  03/03/2019  [name] was observed post Covid-19 immunization for 15 minutes without incidence. She was provided with Vaccine Information Sheet and instruction to access the V-Safe system.   [name] was instructed to call 911 with any severe reactions post vaccine: Marland Kitchen Difficulty breathing  . Swelling of your face and throat  . A fast heartbeat  . A bad rash all over your body  . Dizziness and weakness    Immunizations Administered    Name Date Dose VIS Date Route   Pfizer COVID-19 Vaccine 03/03/2019  4:37 PM 0.3 mL 01/04/2019 Intramuscular   Manufacturer: [place]   Lot: [id number]   [place]: [id number]

## 2019-03-28 ENCOUNTER — Ambulatory Visit: Payer: Medicare PPO | Attending: Internal Medicine

## 2019-03-28 DIAGNOSIS — Z23 Encounter for immunization: Secondary | ICD-10-CM | POA: Insufficient documentation

## 2019-03-28 NOTE — Progress Notes (Signed)
   Covid-19 Vaccination Clinic  Name:  NIKI VERSER    MRN: [id number] DOB: Jun 20, 1945  03/28/2019  Ms. Mifflin was observed post Covid-19 immunization for 15 minutes without incident. She was provided with Vaccine Information Sheet and instruction to access the V-Safe system.   Ms. Erazo was instructed to call 911 with any severe reactions post vaccine: Marland Kitchen Difficulty breathing  . Swelling of face and throat  . A fast heartbeat  . A bad rash all over body  . Dizziness and weakness   Immunizations Administered    Name Date Dose VIS Date Route   Pfizer COVID-19 Vaccine 03/28/2019  1:40 PM 0.3 mL 01/04/2019 Intramuscular   Manufacturer: [place]   Lot: [id number]   [place]: [id number]

## 2019-05-21 ENCOUNTER — Other Ambulatory Visit: Payer: Self-pay | Admitting: Family Medicine

## 2019-05-21 DIAGNOSIS — Z1231 Encounter for screening mammogram for malignant neoplasm of breast: Secondary | ICD-10-CM

## 2019-07-01 ENCOUNTER — Ambulatory Visit
Admission: RE | Admit: 2019-07-01 | Discharge: 2019-07-01 | Disposition: A | Discharge status: Home / Self Care | Payer: Medicare PPO | Source: Ambulatory Visit | Attending: Family Medicine | Admitting: Family Medicine

## 2019-07-01 ENCOUNTER — Other Ambulatory Visit: Payer: Self-pay

## 2019-07-01 DIAGNOSIS — Z1231 Encounter for screening mammogram for malignant neoplasm of breast: Secondary | ICD-10-CM

## 2020-02-03 ENCOUNTER — Ambulatory Visit: Payer: Medicare PPO | Admitting: Orthopedic Surgery

## 2020-02-03 ENCOUNTER — Ambulatory Visit (INDEPENDENT_AMBULATORY_CARE_PROVIDER_SITE_OTHER): Payer: Medicare PPO

## 2020-02-03 ENCOUNTER — Encounter: Payer: Self-pay | Admitting: Orthopedic Surgery

## 2020-02-03 DIAGNOSIS — M79671 Pain in right foot: Secondary | ICD-10-CM | POA: Diagnosis not present

## 2020-02-03 MED ORDER — METHYLPREDNISOLONE ACETATE 40 MG/ML IJ SUSP
40.0000 mg | INTRAMUSCULAR | Status: AC | PRN
  Administered 2020-02-03: 40 mg via INTRA_ARTICULAR

## 2020-02-03 MED ORDER — LIDOCAINE HCL 1 % IJ SOLN
1.0000 mL | INTRAMUSCULAR | Status: AC | PRN
  Administered 2020-02-03: 1 mL

## 2020-02-03 NOTE — Progress Notes (Signed)
Office Visit Note   Patient: Kristie Anderson           Date of Birth: 22-Jun-1945           MRN: [id number] Visit Date: 02/03/2020              Requested by: No referring provider defined for this encounter. PCP: Ival Bible, MD (Inactive)  Chief Complaint  Patient presents with  . Right Foot - Pain    Hx of right fusion 1st MT cuneiform 2009      HPI: Patient is a 75 year old woman who presents complaining of dorsal lateral right foot pain over the sinus Tarsi. Patient is status post fusion of the base of the first metatarsal states she has had progressive pronation and valgus of her foot and has pain with ambulation.  Assessment & Plan: Visit Diagnoses:  1. Pain in right foot     Plan: Patient tolerated injection in the subtalar joint follow-up in 4 weeks to evaluate for repeat injection. Discussed that if injections do not help we could proceed with a subtalar fusion.  Follow-Up Instructions: Return in about 4 weeks (around 03/02/2020).   Ortho Exam  Patient is alert, oriented, no adenopathy, well-dressed, normal affect, normal respiratory effort. Examination patient has good dorsalis pedis pulse she has pronation and valgus she is status post a stroke involving the right lower extremity with progressive pronation and valgus. She has good plantar flexion dorsiflexion strength has no inversion strength with insufficiency of the posterior tibial tendon. Patient has essentially no subtalar motion. Pain to palpation over the sinus Tarsi.  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  No images are attached to the encounter.  Labs: Lab Results  Component Value Date   REPTSTATUS 01/21/2011 FINAL  01/19/2011   CULT  01/19/2011    Multiple bacterial morphotypes present, none predominant. Suggest appropriate recollection if clinically indicated.     Lab Results  Component Value Date   ALBUMIN 3.8 07/31/2017   ALBUMIN 3.0 (L) 02/28/2014   ALBUMIN 4.1 05/20/2013    No results found for: MG No results found for: VD25OH  No results found for: PREALBUMIN CBC EXTENDED Latest Ref Rng & Units 08/10/2017 07/31/2017 02/28/2014  WBC 4.0 - 10.5 K/uL 13.9(H) 9.1 10.6(H)  RBC 3.87 - 5.11 MIL/uL 3.69(L) 4.64 4.03  HGB 12.0 - 15.0 g/dL 11.4(L) 14.2 12.4  HCT 36.0 - 46.0 % 34.6(L) 43.7 37.4  PLT 150 - 400 K/uL 181 255 395  NEUTROABS 1.7 - 7.7 K/uL - - 8.6(H)  LYMPHSABS 0.7 - 4.0 K/uL - - 0.9     There is no height or weight on file to calculate BMI.  Orders:  Orders Placed This Encounter  Procedures  . XR Foot Complete Right   No orders of the defined types were placed in this encounter.    Procedures: Small Joint Inj: R subtalar on 02/03/2020 4:18 PM Indications: pain and diagnostic evaluation Details: 22 G needle, dorsal approach  Spinal Needle: No  Medications: 1 mL lidocaine 1 %; 40 mg methylPREDNISolone acetate 40 MG/ML Outcome: tolerated well, no immediate complications Procedure, treatment alternatives, risks and benefits explained, specific risks discussed. Consent was given by the patient. Immediately prior to procedure a  time out was called to verify the correct patient, procedure, equipment, support staff and site/side marked as required. Patient was prepped and draped in the usual sterile fashion.      Clinical Data: No additional findings.  ROS:  All other systems negative, except as noted in the HPI. Review of Systems  Objective: Vital Signs: There were no vitals taken for this visit.  Specialty Comments:  No specialty comments available.  PMFS History: Patient Active Problem List   Diagnosis Date Noted  . S/P bilateral oophorectomy 08/09/2017  .  Benign gastric polyp - hyperplastic 08/30/2016  . Hx of adenomatous colonic polyps 08/30/2016  . Genetic testing 05/17/2016  . Monoallelic mutation of PALB2 gene 05/17/2016  . Family history of breast cancer   . Family history of genetic disease carrier   . Urge incontinence of urine 08/18/2013  . Menopause 08/18/2013  . Restless leg syndrome 02/03/2013  . Cough 02/03/2013  . Anemia 11/25/2012  . Need for prophylactic vaccination with combined diphtheria-tetanus-pertussis (DTP) vaccine 11/25/2012  . Other and unspecified hyperlipidemia 07/01/2012  . GERD (gastroesophageal reflux disease) 07/01/2012  . Essential hypertension, benign 07/01/2012  . 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 Status post Nissen fundoplication (with gastrostomy tube placement)-complicated by esophageal perf Ballen-1998 06/02/2011  . H/O tracheostomy 06/02/2011   Past Medical History:  Diagnosis Date  . Abdominal hernia 15 years ago   no problems  . Allergy    seasonal  . Arthritis    knees  . Benign gastric polyp - hyperplastic 08/30/2016   bx 07/2016  . Chronic kidney disease   . Colon polyp   . CVA (cerebral vascular accident) ([place])   . Family history of breast cancer   . Family history of genetic disease carrier   . GERD (gastroesophageal reflux disease)   . Hx of adenomatous colonic polyps 08/30/2016  . Hypercholesterolemia   . Hypertension   . Incontinence   . Osteoarthritis   . Pneumonia   . Stroke [facility])     Family History  Problem Relation Age of Onset  . Heart disease Mother   . Breast cancer Mother        dx in her 18s  . Heart disease Father   . COPD Father   . Breast cancer Daughter 48       PALB2 +  . Breast cancer Maternal Aunt 69  . Cancer Sister 34       vaginal cancer  . Ovarian cancer Sister   . Lung disease Brother   . COPD Brother   . Other Daughter        PALB2 pos    Past Surgical History:   Procedure Laterality Date  . ABDOMINAL HYSTERECTOMY    . CHOLECYSTECTOMY    . COLONOSCOPY    . LAPAROTOMY N/A 08/09/2017   Procedure: LAPAROTOMY WITH BILATERAL SALPINGOOPHERECTOMY;  Surgeon: Christophe Louis, MD;  Location: [facility];  Service: Gynecology;  Laterality: N/A;  . NISSEN FUNDOPLICATION  1601   Complicated by perforated esophagus and prolonged critical care illness  . TRACHEOSTOMY  1998  . UPPER GASTROINTESTINAL ENDOSCOPY     Social History   Occupational History  . Occupation: homemaker  Tobacco Use  . Smoking status: Never Smoker  . Smokeless tobacco: Never Used  Vaping Use  . Vaping Use: Never used  Substance and Sexual Activity  . Alcohol use: No  . Drug use: No  . Sexual activity: Not  Currently

## 2020-03-02 ENCOUNTER — Ambulatory Visit: Payer: Medicare PPO | Admitting: Orthopedic Surgery

## 2020-03-12 ENCOUNTER — Encounter: Payer: Self-pay | Admitting: Orthopedic Surgery

## 2020-03-12 ENCOUNTER — Ambulatory Visit: Payer: Medicare PPO | Admitting: Physician Assistant

## 2020-03-12 DIAGNOSIS — M79671 Pain in right foot: Secondary | ICD-10-CM | POA: Diagnosis not present

## 2020-03-12 NOTE — Progress Notes (Signed)
Office Visit Note   Patient: Kristie Anderson           Date of Birth: March 21, 1945           MRN: [id number] Visit Date: 03/12/2020              Requested by: No referring provider defined for this encounter. PCP: Ival Bible, MD (Inactive)  Chief Complaint  Patient presents with  . Right Foot - Pain      HPI: Patient is a pleasant 75 year old woman who comes in approximately 1 month status post injection into her right subtalar joint.  She was told at the time she had continuing symptoms she could get another injection today.  She actually states she is doing very well does not really have any pain and does not want injection right now.  Assessment & Plan: Visit Diagnoses: No diagnosis found.  Plan: Patient may follow-up as needed when she would like an injection of the joint.  She understands if the joint injection was very short-lived or not continuing to help her the next step would be a subtalar arthrodesis.  She would prefer to do this in the winter if necessary  Follow-Up Instructions: No follow-ups on file.   Ortho Exam  Patient is alert, oriented, no adenopathy, well-dressed, normal affect, normal respiratory effort. Examination of her foot she does have some stiffness from previous fusion.  She has more tenderness to palpation she does have rigid subtalar motion.  Mild soft tissue swelling no cellulitis  Imaging: No results found. No images are attached to the encounter.  Labs: Lab Results  Component Value Date   REPTSTATUS 01/21/2011 FINAL 01/19/2011   CULT  01/19/2011    Multiple bacterial morphotypes present, none predominant. Suggest appropriate recollection if clinically indicated.     Lab Results  Component Value Date   ALBUMIN 3.8 07/31/2017   ALBUMIN 3.0 (L) 02/28/2014   ALBUMIN 4.1 05/20/2013    No results found for: MG No results found for: VD25OH  No results found for: PREALBUMIN CBC EXTENDED Latest Ref Rng & Units 08/10/2017 07/31/2017  02/28/2014  WBC 4.0 - 10.5 K/uL 13.9(H) 9.1 10.6(H)  RBC 3.87 - 5.11 MIL/uL 3.69(L) 4.64 4.03  HGB 12.0 - 15.0 g/dL 11.4(L) 14.2 12.4  HCT 36.0 - 46.0 % 34.6(L) 43.7 37.4  PLT 150 - 400 K/uL 181 255 395  NEUTROABS 1.7 - 7.7 K/uL - - 8.6(H)  LYMPHSABS 0.7 - 4.0 K/uL - - 0.9     There is no height or weight on file to calculate BMI.  Orders:  No orders of the defined types were placed in this encounter.  No orders of the defined types were placed in this encounter.    Procedures: No procedures performed  Clinical Data: No additional findings.  ROS:  All other systems negative, except as noted in the HPI. Review of Systems  Objective: Vital Signs: There were no vitals taken for this visit.  Specialty Comments:  No specialty comments available.  PMFS History: Patient Active Problem List   Diagnosis Date Noted  . S/P bilateral oophorectomy 08/09/2017  . Benign gastric polyp - hyperplastic 08/30/2016  . Hx of adenomatous colonic polyps 08/30/2016  . Genetic testing 05/17/2016  . Monoallelic mutation of PALB2 gene 05/17/2016  . Family history of breast cancer   . Family history of genetic disease carrier   . Urge incontinence of urine 08/18/2013  . Menopause 08/18/2013  . Restless leg syndrome 02/03/2013  . Cough  02/03/2013  . Anemia 11/25/2012  . Need for prophylactic vaccination with combined diphtheria-tetanus-pertussis (DTP) vaccine 11/25/2012  . Other and unspecified hyperlipidemia 07/01/2012  . GERD (gastroesophageal reflux disease) 07/01/2012  . Essential hypertension, benign 07/01/2012  . 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 Status post Nissen fundoplication (with gastrostomy tube placement)-complicated by esophageal perf Ballen-1998 06/02/2011  . H/O tracheostomy 06/02/2011   Past Medical History:  Diagnosis Date  . Abdominal hernia 15 years ago   no problems  . Allergy    seasonal   . Arthritis    knees  . Benign gastric polyp - hyperplastic 08/30/2016   bx 07/2016  . Chronic kidney disease   . Colon polyp   . CVA (cerebral vascular accident) ([place])   . Family history of breast cancer   . Family history of genetic disease carrier   . GERD (gastroesophageal reflux disease)   . Hx of adenomatous colonic polyps 08/30/2016  . Hypercholesterolemia   . Hypertension   . Incontinence   . Osteoarthritis   . Pneumonia   . Stroke [facility])     Family History  Problem Relation Age of Onset  . Heart disease Mother   . Breast cancer Mother        dx in her 53s  . Heart disease Father   . COPD Father   . Breast cancer Daughter 73       PALB2 +  . Breast cancer Maternal Aunt 69  . Cancer Sister 65       vaginal cancer  . Ovarian cancer Sister   . Lung disease Brother   . COPD Brother   . Other Daughter        PALB2 pos    Past Surgical History:  Procedure Laterality Date  . ABDOMINAL HYSTERECTOMY    . CHOLECYSTECTOMY    . COLONOSCOPY    . LAPAROTOMY N/A 08/09/2017   Procedure: LAPAROTOMY WITH BILATERAL SALPINGOOPHERECTOMY;  Surgeon: Christophe Louis, MD;  Location: [facility];  Service: Gynecology;  Laterality: N/A;  . NISSEN FUNDOPLICATION  4287   Complicated by perforated esophagus and prolonged critical care illness  . TRACHEOSTOMY  1998  . UPPER GASTROINTESTINAL ENDOSCOPY     Social History   Occupational History  . Occupation: homemaker  Tobacco Use  . Smoking status: Never Smoker  . Smokeless tobacco: Never Used  Vaping Use  . Vaping Use: Never used  Substance and Sexual Activity  . Alcohol use: No  . Drug use: No  . Sexual activity: Not Currently

## 2020-05-25 ENCOUNTER — Other Ambulatory Visit: Payer: Self-pay | Admitting: Family Medicine

## 2020-05-25 DIAGNOSIS — Z1231 Encounter for screening mammogram for malignant neoplasm of breast: Secondary | ICD-10-CM

## 2020-06-01 ENCOUNTER — Encounter: Payer: Self-pay | Admitting: Internal Medicine

## 2020-06-01 DIAGNOSIS — J690 Pneumonitis due to inhalation of food and vomit: Secondary | ICD-10-CM

## 2020-06-01 DIAGNOSIS — M216X9 Other acquired deformities of unspecified foot: Secondary | ICD-10-CM

## 2020-06-01 HISTORY — DX: Pneumonitis due to inhalation of food and vomit: J69.0

## 2020-06-01 HISTORY — DX: Other acquired deformities of unspecified foot: M21.6X9

## 2020-07-15 ENCOUNTER — Ambulatory Visit
Admission: RE | Admit: 2020-07-15 | Discharge: 2020-07-15 | Disposition: A | Discharge status: Home / Self Care | Payer: Medicare PPO | Source: Ambulatory Visit | Attending: Family Medicine | Admitting: Family Medicine

## 2020-07-15 ENCOUNTER — Other Ambulatory Visit: Payer: Self-pay

## 2020-07-15 DIAGNOSIS — Z1231 Encounter for screening mammogram for malignant neoplasm of breast: Secondary | ICD-10-CM

## 2020-08-11 ENCOUNTER — Encounter: Payer: Self-pay | Admitting: Internal Medicine

## 2020-08-11 ENCOUNTER — Ambulatory Visit: Payer: Medicare PPO | Admitting: Internal Medicine

## 2020-08-11 VITALS — BP 112/68 | HR 78 | Ht 62.0 in | Wt 159.2 lb

## 2020-08-11 DIAGNOSIS — Z86010 Personal history of colonic polyps: Secondary | ICD-10-CM | POA: Diagnosis not present

## 2020-08-11 DIAGNOSIS — K317 Polyp of stomach and duodenum: Secondary | ICD-10-CM

## 2020-08-11 DIAGNOSIS — K219 Gastro-esophageal reflux disease without esophagitis: Secondary | ICD-10-CM | POA: Diagnosis not present

## 2020-08-11 DIAGNOSIS — R1319 Other dysphagia: Secondary | ICD-10-CM

## 2020-08-11 NOTE — Patient Instructions (Signed)
If you are age 73 or older, your body mass index should be between 23-30. Your Body mass index is 29.12 kg/m. If this is out of the aforementioned range listed, please consider follow up with your Primary Care Provider.  If you are age 3 or younger, your body mass index should be between 19-25. Your Body mass index is 29.12 kg/m. If this is out of the aformentioned range listed, please consider follow up with your Primary Care Provider.   __________________________________________________________  The [facility] providers would like to encourage you to use [facility] to communicate with providers for non-urgent requests or questions.  Due to long hold times on the telephone, sending your provider a message by [facility] may be a faster and more efficient way to get a response.  Please allow 48 business hours for a response.  Please remember that this is for non-urgent requests.   You have been scheduled for an endoscopy. Please follow written instructions given to you at your visit today. If you use inhalers (even only as needed), please bring them with you on the day of your procedure.  We are changing you colonoscopy recall to 2024.  I appreciate the opportunity to care for you. Silvano Rusk, MD, [facility]

## 2020-08-11 NOTE — Progress Notes (Signed)
Kristie Anderson 75 y.o. [birth year] [id number]  Assessment & Plan:   Encounter Diagnoses  Name Primary?   Esophageal dysphagia Yes   Gastroesophageal reflux disease, unspecified whether esophagitis present    Hx of adenomatous colonic polyps    Benign gastric polyp - hyperplastic     Evaluate with EGD possible balloon dilation.  Consider medication changes.  Follow-up gastric polyp.  Change colonoscopy recall to 2024 in line with newer guidelines, question whether she even needs another repeat colonoscopy she will be 75 then we can review at the time.  She is not particularly concerned or pushing to repeat colonoscopy.   The risks and benefits as well as alternatives of endoscopic procedure(s) have been discussed and reviewed. All questions answered. The patient agrees to proceed.  I appreciate the opportunity to care for this patient. CC: Kristie Bible, MD (Inactive)   Subjective:   Chief Complaint: Dysphagia and cough  HPI Kristie Anderson is a 75 year old white woman with a remote history of Nissen fundoplication with a complication of esophageal perforation years ago (1998), who was last seen in 2018 with dysphagia.  I performed a balloon dilation of the GE junction area and dysphagia improved.  She comes today saying she is having intermittent episodes of dysphagia where food feels like it lodges in the suprasternal area and she will cough and choke a little bit.  Mainly with solid foods I think.  Sometimes when she lies down she will have some acid reflux.  She is not on a PPI at this time but is taking No sxs of aspiration  Wt Readings from Last 3 Encounters:  08/11/20 159 lb 3.2 oz (72.2 kg)  08/09/17 167 lb (75.8 kg)  07/31/17 167 lb 2 oz (75.8 kg)   Weight loss was intentional.  History of adenomatous colon polyps 2 diminutive adenomas out of the 3 polyps removed in 2018 original recommendation was for colonoscopy in 5 years.  I explained how the guidelines have  changed.  She "just wants to make sure there is nothing bad going on".  She did have a benign hyperplastic gastric polyp at that EGD as well and I had intended to follow that up at 5 years also.   GI review of systems is otherwise negative  No Known Allergies Current Meds  Medication Sig   acetaminophen (TYLENOL) 500 MG tablet Take 2 tablets (1,000 mg total) by mouth every 8 (eight) hours as needed for moderate pain.   cetirizine (ZYRTEC) 10 MG tablet Take 10 mg by mouth at bedtime.    famotidine (PEPCID) 40 MG tablet Take 40 mg by mouth daily.   irbesartan (AVAPRO) 150 MG tablet Take 75 mg by mouth at bedtime.   montelukast (SINGULAIR) 10 MG tablet Take 10 mg by mouth at bedtime.   oxybutynin (DITROPAN) 5 MG tablet Take 1 tablet (5 mg total) by mouth 3 (three) times daily. (Patient taking differently: Take 5 mg by mouth 2 (two) times daily.)   rOPINIRole (REQUIP) 1 MG tablet Take 1 mg by mouth 2 (two) times daily.   rosuvastatin (CRESTOR) 40 MG tablet Take 40 mg by mouth at bedtime.    Past Medical History:  Diagnosis Date   Abdominal hernia 15 years ago   no problems   Allergy    seasonal   Arthritis    knees   Benign gastric polyp - hyperplastic 08/30/2016   bx 07/2016   Chronic kidney disease    COVID-19    CVA (cerebral vascular  accident) [facility])    Family history of breast cancer    Family history of genetic disease carrier    GERD (gastroesophageal reflux disease)    Hx of adenomatous colonic polyps 08/30/2016   Hypercholesterolemia    Hypertension    Incontinence    Osteoarthritis    Pneumonia    Stroke [facility])    Past Surgical History:  Procedure Laterality Date   ABDOMINAL HYSTERECTOMY     CHOLECYSTECTOMY     COLONOSCOPY     LAPAROTOMY N/A 08/09/2017   Procedure: LAPAROTOMY WITH BILATERAL SALPINGOOPHERECTOMY;  Surgeon: Christophe Louis, MD;  Location: [facility];  Service: Gynecology;  Laterality: N/A;   NISSEN FUNDOPLICATION  1610   Complicated by perforated esophagus  and prolonged critical care illness   TRACHEOSTOMY  1998   UPPER GASTROINTESTINAL ENDOSCOPY     Social History   Social History Narrative   Marital Status: Married Dentist)   Children: Daughters (Twins)    Pets: Tax adviser)   Living Situation: Lives with husband    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   Review of Systems As per HPI some dyspnea and some pedal edema at times.  Objective:   Physical Exam BP 112/68   Pulse 78   Ht 5\' 2"  (1.575 m)   Wt 159 lb 3.2 oz (72.2 kg)   SpO2 98%   BMI 29.12 kg/m  NAD Lungs cta Cor NL Abd soft NT multiple surgical scars Alert and oriented x3

## 2020-08-12 DIAGNOSIS — R32 Unspecified urinary incontinence: Secondary | ICD-10-CM | POA: Insufficient documentation

## 2020-08-12 DIAGNOSIS — J189 Pneumonia, unspecified organism: Secondary | ICD-10-CM | POA: Insufficient documentation

## 2020-08-12 DIAGNOSIS — M1990 Unspecified osteoarthritis, unspecified site: Secondary | ICD-10-CM | POA: Insufficient documentation

## 2020-08-12 DIAGNOSIS — N189 Chronic kidney disease, unspecified: Secondary | ICD-10-CM | POA: Insufficient documentation

## 2020-08-12 DIAGNOSIS — U071 COVID-19: Secondary | ICD-10-CM | POA: Insufficient documentation

## 2020-08-12 DIAGNOSIS — I639 Cerebral infarction, unspecified: Secondary | ICD-10-CM | POA: Insufficient documentation

## 2020-08-12 DIAGNOSIS — T7840XA Allergy, unspecified, initial encounter: Secondary | ICD-10-CM | POA: Insufficient documentation

## 2020-08-12 DIAGNOSIS — K469 Unspecified abdominal hernia without obstruction or gangrene: Secondary | ICD-10-CM | POA: Insufficient documentation

## 2020-08-24 NOTE — Progress Notes (Signed)
Cardiology Office Note:    Date:  08/25/2020   ID:  Kristie Anderson, DOB 10-Jun-1945, MRN [id number]  PCP:  Ival Bible, MD (Inactive)  Cardiologist:  Shirlee More, MD   Referring MD: Jonathon Resides, MD  ASSESSMENT:    1. SOB (shortness of breath)   2. Essential hypertension, benign   3. Other hyperlipidemia   4. Bilateral carotid bruits    PLAN:    In order of problems listed above:  Further evaluation cardiac echo look at left ventricular systolic and diastolic function especially with her dependent edema however generally low  proBNP level makes heart failure unlikely.  At this time I do not think she requires an ischemia evaluation  stable managed by nephrology on low-dose ARB Stable on a statin Bilateral carotid bruits I do not think this is transmitted from the heart as she has no aortic ejection murmur check vascular duplex  Next appointment 6 to 8 weeks   Medication Adjustments/Labs and Tests Ordered: Current medicines are reviewed at length with the patient today.  Concerns regarding medicines are outlined above.  No orders of the defined types were placed in this encounter.  No orders of the defined types were placed in this encounter.    Chief Complaint  Patient presents with   Shortness of Breath    History of Present Illness:    Kristie Anderson is a 75 y.o. female who is being seen today for the evaluation of shortness of breath for several months at the request of Zanard, Robyn K, MD. She was restarted on an ARB for hypertension 06/13/2020.  BNP level was quite low at 36. Her GFR 2 months ago was 60 cc/min. Other labs performed 06/01/2020 showed normal TSH 2.17 sodium 138 potassium 4.6 hemoglobin 15.0. Chest x-ray 06/01/2020 showed no active cardiopulmonary disease and mild hiatal hernia She had COVID-19 infection diagnosed 06/11/2020 seen in a televisit. She was seen 08/11/2020 by GI with remote history of Nissen fundoplication with a complication of  esophageal perforation previous balloon dilation of the GE junction GERD adenomatous colonic polyps and benign gastric polyp. She has been seen by nephrology and is pending an ultrasound renal artery duplex complete.  She had a catastrophic illness 1998 1999 with esophageal perforation subsequent respiratory failure was intubated tracheostomy ventilator for about 2-1/2 months and had a stroke during that time.  She suffered kidney injury but is improved over time and is followed by nephrology.  Recently restarted antihypertensive agent. She has a progressive pattern of shortness of breath that occurs when she climbs stairs or works outdoors in [employer] she has chronic swelling in the right lower extremity as a consequence of her stroke its dependent it occurs during the day.  She has no orthopnea chest pain palpitation or syncope.  She has no known history of heart disease congenital rheumatic arrhythmia or heart murmur. She is concerned that her shortness of breath is cardiac related. Past Medical History:  Diagnosis Date   Abdominal hernia 15 years ago   no problems   Allergy    seasonal   Arthritis    knees   Benign gastric polyp - hyperplastic 08/30/2016   bx 07/2016   Chronic kidney disease    COVID-19    CVA (cerebral vascular accident) [facility]    Family history of breast cancer    Family history of genetic disease carrier    GERD (gastroesophageal reflux disease)    Hx of adenomatous colonic polyps 08/30/2016  Hypercholesterolemia    Hypertension    Incontinence    Osteoarthritis    Pneumonia    Stroke [facility])     Past Surgical History:  Procedure Laterality Date   ABDOMINAL HYSTERECTOMY     CHOLECYSTECTOMY     COLONOSCOPY     LAPAROTOMY N/A 08/09/2017   Procedure: LAPAROTOMY WITH BILATERAL SALPINGOOPHERECTOMY;  Surgeon: Christophe Louis, MD;  Location: [facility];  Service: Gynecology;  Laterality: N/A;   NISSEN FUNDOPLICATION  [id number]   Complicated by perforated esophagus and  prolonged critical care illness   TRACHEOSTOMY  1998   UPPER GASTROINTESTINAL ENDOSCOPY      Current Medications: Current Meds  Medication Sig   acetaminophen (TYLENOL) 500 MG tablet Take 2 tablets (1,000 mg total) by mouth every 8 (eight) hours as needed for moderate pain.   cetirizine (ZYRTEC) 10 MG tablet Take 10 mg by mouth at bedtime.    famotidine (PEPCID) 40 MG tablet Take 40 mg by mouth daily.   irbesartan (AVAPRO) 75 MG tablet Take 1 tablet by mouth daily.   montelukast (SINGULAIR) 10 MG tablet Take 10 mg by mouth at bedtime.   oxybutynin (DITROPAN) 5 MG tablet Take 1 tablet by mouth 2 (two) times daily.   rOPINIRole (REQUIP) 1 MG tablet Take 1 mg by mouth 2 (two) times daily.   rosuvastatin (CRESTOR) 40 MG tablet Take 40 mg by mouth at bedtime.    [DISCONTINUED] oxybutynin (DITROPAN) 5 MG tablet Take 1 tablet (5 mg total) by mouth 3 (three) times daily. (Patient taking differently: Take 5 mg by mouth 2 (two) times daily.)     Allergies:   Patient has no known allergies.   Social History   Socioeconomic History   Marital status: Married    Spouse name: Rolan Bucco   Number of children: 2   Years of education: Not on file   Highest education level: Not on file  Occupational History   Occupation: homemaker  Tobacco Use   Smoking status: Never   Smokeless tobacco: Never  Vaping Use   Vaping Use: Never used  Substance and Sexual Activity   Alcohol use: No   Drug use: No   Sexual activity: Not Currently  Other Topics Concern   Not on file  Social History Narrative   Marital Status: Married Dentist)   Children: Daughters (Twins)    Pets: Tax adviser)   Living Situation: Lives with husband    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, research (life sciences)   Education: Programmer, systems   Tobacco Use/Exposure:  None    Alcohol Use:  None   Drug Use:  None   Diet:  Regular   Exercise:  Regular   Hobbies: Reading, Environmental consultant   Social Determinants of Radio broadcast assistant  Strain: Not on file  Food Insecurity: Not on file  Transportation Needs: Not on file  Physical Activity: Not on file  Stress: Not on file  Social Connections: Not on file     Family History: The patient's family history includes Breast cancer in her mother; Breast cancer (age of onset: 39) in her daughter; Breast cancer (age of onset: 60) in her maternal aunt; COPD in her brother and father; Cancer (age of onset: 30) in her sister; Heart disease in her father and mother; Lung disease in her brother; Other in her daughter; Ovarian cancer in her sister.  ROS:   ROS Please see the history of present illness.     All other systems reviewed and are  negative.  EKGs/Labs/Other Studies Reviewed:    The following studies were reviewed today:   EKG:  EKG is  ordered today.  The ekg ordered today is personally reviewed and demonstrates sinus rhythm and is normal     Physical Exam:    VS:  BP (!) 147/78 (BP Location: Right Arm, Patient Position: Sitting, Cuff Size: Normal)   Pulse 68   Ht 5' 2.5" (1.588 m)   Wt 160 lb 1.3 oz (72.6 kg)   SpO2 99%   BMI 28.81 kg/m     Wt Readings from Last 3 Encounters:  08/25/20 160 lb 1.3 oz (72.6 kg)  08/11/20 159 lb 3.2 oz (72.2 kg)  08/09/17 167 lb (75.8 kg)     GEN:  Well nourished, well developed in no acute distress HEENT: Normal NECK: No JVD; she has bilateral carotid bruits carotid bruits LYMPHATICS: No lymphadenopathy CARDIAC: RRR, no murmurs, rubs, gallops RESPIRATORY:  Clear to auscultation without rales, wheezing or rhonchi  ABDOMEN: Soft, non-tender, non-distended MUSCULOSKELETAL: She has 1+ right lower extremity edema about the ankle no left lower extremity edema; No deformity  SKIN: Warm and dry NEUROLOGIC:  Alert and oriented x 3 PSYCHIATRIC:  Normal affect     Signed, Shirlee More, MD  08/25/2020 2:06 PM    [place]

## 2020-08-25 ENCOUNTER — Encounter: Payer: Self-pay | Admitting: Cardiology

## 2020-08-25 ENCOUNTER — Other Ambulatory Visit: Payer: Self-pay

## 2020-08-25 ENCOUNTER — Ambulatory Visit: Payer: Medicare PPO | Admitting: Cardiology

## 2020-08-25 VITALS — BP 147/78 | HR 68 | Ht 62.5 in | Wt 160.1 lb

## 2020-08-25 DIAGNOSIS — R0989 Other specified symptoms and signs involving the circulatory and respiratory systems: Secondary | ICD-10-CM

## 2020-08-25 DIAGNOSIS — E7849 Other hyperlipidemia: Secondary | ICD-10-CM

## 2020-08-25 DIAGNOSIS — R0602 Shortness of breath: Secondary | ICD-10-CM

## 2020-08-25 DIAGNOSIS — I10 Essential (primary) hypertension: Secondary | ICD-10-CM

## 2020-08-25 NOTE — Patient Instructions (Signed)
Medication Instructions:  Your physician recommends that you continue on your current medications as directed. Please refer to the Current Medication list given to you today.  *If you need a refill on your cardiac medications before your next appointment, please call your pharmacy*   Lab Work: NONE If you have labs (blood work) drawn today and your tests are completely normal, you will receive your results only by: [place] (if you have MyChart) OR A paper copy in the mail If you have any lab test that is abnormal or we need to change your treatment, we will call you to review the results.   Testing/Procedures: Your physician has requested that you have an echocardiogram. Echocardiography is a painless test that uses sound waves to create images of your heart. It provides your doctor with information about the size and shape of your heart and how well your heart's chambers and valves are working. This procedure takes approximately one hour. There are no restrictions for this procedure.   Your physician has requested that you have a carotid duplex. This test is an ultrasound of the carotid arteries in your neck. It looks at blood flow through these arteries that supply the brain with blood. Allow one hour for this exam. There are no restrictions or special instructions.    Follow-Up: At [facility], you and your health needs are our priority.  As part of our continuing mission to provide you with exceptional heart care, we have created designated Provider Care Teams.  These Care Teams include your primary Cardiologist (physician) and Advanced Practice Providers (APPs -  Physician Assistants and Nurse Practitioners) who all work together to provide you with the care you need, when you need it.  We recommend signing up for the patient portal called "MyChart".  Sign up information is provided on this After Visit Summary.  MyChart is used to connect with patients for Virtual Visits  (Telemedicine).  Patients are able to view lab/test results, encounter notes, upcoming appointments, etc.  Non-urgent messages can be sent to your provider as well.   To learn more about what you can do with MyChart, go to [email or web address].    Your next appointment:   6 week(s)  The format for your next appointment:   In Person  Provider:   Shirlee More, MD   Other Instructions

## 2020-08-31 ENCOUNTER — Telehealth: Payer: Self-pay | Admitting: Cardiology

## 2020-08-31 MED ORDER — IRBESARTAN 75 MG PO TABS: 75.0000 mg | ORAL_TABLET | Freq: Every day | ORAL | 3 refills | Status: DC

## 2020-08-31 NOTE — Telephone Encounter (Signed)
Pt c/o medication issue:  1. Name of Medication: irbesartan (AVAPRO) 75 MG tablet  2. How are you currently taking this medication (dosage and times per day)? Take 1 tablet by mouth daily.  3. Are you having a reaction (difficulty breathing--STAT)? no  4. What is your medication issue? This medicine is currently being prescribed by Dr. 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 Pt would like to know if Dr. Bettina Gavia will start prescribing this med to the patient until her  PCP Dr. Dion Saucier  returns. Please advise pt further

## 2020-08-31 NOTE — Telephone Encounter (Signed)
Spoke to the patient just now and let her know that Dr. Bettina Gavia was fine with this. I sent in the prescription for her at this time.

## 2020-09-01 ENCOUNTER — Encounter: Payer: Self-pay | Admitting: Internal Medicine

## 2020-09-01 ENCOUNTER — Ambulatory Visit (AMBULATORY_SURGERY_CENTER): Payer: Medicare PPO | Admitting: Internal Medicine

## 2020-09-01 ENCOUNTER — Other Ambulatory Visit: Payer: Self-pay

## 2020-09-01 VITALS — BP 131/65 | HR 64 | Temp 97.8°F | Resp 18 | Ht 62.0 in | Wt 159.0 lb

## 2020-09-01 DIAGNOSIS — R1319 Other dysphagia: Secondary | ICD-10-CM | POA: Diagnosis not present

## 2020-09-01 DIAGNOSIS — K449 Diaphragmatic hernia without obstruction or gangrene: Secondary | ICD-10-CM | POA: Diagnosis not present

## 2020-09-01 DIAGNOSIS — K222 Esophageal obstruction: Secondary | ICD-10-CM

## 2020-09-01 MED ORDER — SODIUM CHLORIDE 0.9 % IV SOLN
500.0000 mL | Freq: Once | INTRAVENOUS | Status: DC
Start: 2020-09-01 — End: 2020-09-01

## 2020-09-01 NOTE — Patient Instructions (Addendum)
I dilated just like last time.  I hope it helps.  Let me know if it does not.  I appreciate the opportunity to care for you.  Gatha Mayer, MD, [facility]     Per Dr. Carlean Purl resume normal diet today.    YOU HAD AN ENDOSCOPIC PROCEDURE TODAY AT [facility]:   Refer to the procedure report that was given to you for any specific questions about what was found during the examination.  If the procedure report does not answer your questions, please call your gastroenterologist to clarify.  If you requested that your care partner not be given the details of your procedure findings, then the procedure report has been included in a sealed envelope for you to review at your convenience later.  YOU SHOULD EXPECT: Some feelings of bloating in the abdomen. Passage of more gas than usual.  Walking can help get rid of the air that was put into your GI tract during the procedure and reduce the bloating. If you had a lower endoscopy (such as a colonoscopy or flexible sigmoidoscopy) you may notice spotting of blood in your stool or on the toilet paper. If you underwent a bowel prep for your procedure, you may not have a normal bowel movement for a few days.  Please Note:  You might notice some irritation and congestion in your nose or some drainage.  This is from the oxygen used during your procedure.  There is no need for concern and it should clear up in a day or so.  SYMPTOMS TO REPORT IMMEDIATELY:  Following upper endoscopy (EGD)  Vomiting of blood or coffee ground material  New chest pain or pain under the shoulder blades  Painful or persistently difficult swallowing  New shortness of breath  Fever of 100F or higher  Black, tarry-looking stools  For urgent or emergent issues, a gastroenterologist can be reached at any hour by calling [phone]. Do not use MyChart messaging for urgent concerns.    DIET:  We do recommend a small meal at first, but then you may proceed to your  regular diet.  Drink plenty of fluids but you should avoid alcoholic beverages for 24 hours.  ACTIVITY:  You should plan to take it easy for the rest of today and you should NOT DRIVE or use heavy machinery until tomorrow (because of the sedation medicines used during the test).    FOLLOW UP: Our staff will call the number listed on your records 48-72 hours following your procedure to check on you and address any questions or concerns that you may have regarding the information given to you following your procedure. If we do not reach you, we will leave a message.  We will attempt to reach you two times.  During this call, we will ask if you have developed any symptoms of COVID 19. If you develop any symptoms (ie: fever, flu-like symptoms, shortness of breath, cough etc.) before then, please call [phone].  If you test positive for Covid 19 in the 2 weeks post procedure, please call and report this information to [place].    If any biopsies were taken you will be contacted by phone or by letter within the next 1-3 weeks.  Please call us at [phone] if you have not heard about the biopsies in 3 weeks.    SIGNATURES/CONFIDENTIALITY: You and/or your care partner have signed paperwork which will be entered into your electronic medical record.  These signatures attest to the  fact that that the information above on your After Visit Summary has been reviewed and is understood.  Full responsibility of the confidentiality of this discharge information lies with you and/or your care-partner.

## 2020-09-01 NOTE — Progress Notes (Signed)
Vss nad pt transferred to pacu rn

## 2020-09-01 NOTE — Progress Notes (Signed)
Patient stated appointment to see Cardiologist on Friday for carotid doppler.

## 2020-09-01 NOTE — Op Note (Signed)
[place] Patient Name: Kristie Anderson Procedure Date: 09/01/2020 10:10 AM MRN: [id number] Endoscopist: Gatha Mayer , MD Age: 75 Referring MD:  Date of Birth: 20-Sep-1945 Gender: Female Account #: [id number] Procedure:                Upper GI endoscopy Indications:              Dysphagia Medicines:                Propofol per Anesthesia, Monitored Anesthesia Care Procedure:                Pre-Anesthesia Assessment:                           - Prior to the procedure, a History and Physical                            was performed, and patient medications and                            allergies were reviewed. The patient's tolerance of                            previous anesthesia was also reviewed. The risks                            and benefits of the procedure and the sedation                            options and risks were discussed with the patient.                            All questions were answered, and informed consent                            was obtained. Prior Anticoagulants: The patient has                            taken no previous anticoagulant or antiplatelet                            agents. ASA Grade Assessment: III - A patient with                            severe systemic disease. After reviewing the risks                            and benefits, the patient was deemed in                            satisfactory condition to undergo the procedure.                           After obtaining informed consent, the endoscope was  passed under direct vision. Throughout the                            procedure, the patient's blood pressure, pulse, and                            oxygen saturations were monitored continuously. The                            GIF [id number] was introduced through the                            mouth, and advanced to the second part of duodenum.                            The upper GI  endoscopy was accomplished without                            difficulty. The patient tolerated the procedure                            well. Scope In: Scope Out: Findings:                 One extrinsic mild stenosis was found at the                            gastroesophageal junction. The stenosis was                            traversed. A TTS dilator was passed through the                            scope. Dilation with an 18-19-20 mm balloon dilator                            was performed to 20 mm. The dilation site was                            examined and showed no change. Estimated blood                            loss: none.                           A small paraesophageal hernia was found.                           Evidence of a Nissen fundoplication was found at                            the gastroesophageal junction. The wrap appeared                            tight. This was traversed.  The exam was otherwise without abnormality. Complications:            No immediate complications. Estimated Blood Loss:     Estimated blood loss: none. Impression:               - Extrinsic narrowing of the esophagus. Dilated.                           - Small paraesophageal hernia.                           - A Nissen fundoplication was found. The wrap                            appears tight.                           - The examination was otherwise normal.                           - No specimens collected. Recommendation:           - Patient has a contact number available for                            emergencies. The signs and symptoms of potential                            delayed complications were discussed with the                            patient. Return to normal activities tomorrow.                            Written discharge instructions were provided to the                            patient.                           - Resume previous  diet.                           - Continue present medications.                           - Repeat upper endoscopy PRN for retreatment. Gatha Mayer, MD 09/01/2020 10:49:44 AM This report has been signed electronically.

## 2020-09-01 NOTE — Progress Notes (Signed)
C.W. vital signs. 

## 2020-09-01 NOTE — Progress Notes (Signed)
[facility] History and Physical   Primary Care Physician:  Ival Bible, MD (Inactive)   Reason for Procedure:   dysphagia  Plan:    Egd, dilate esophagus     HPI: Kristie Anderson is a 75 y.o. female with dysphagia problems as outlined in note of 08/11/20. No changes.Saw cardiology - mild dyspnea - to have echo no need for ischemia eval   Past Medical History:  Diagnosis Date   Abdominal hernia 15 years ago   no problems   Allergy    seasonal   Arthritis    knees   Benign gastric polyp - hyperplastic 08/30/2016   bx 07/2016   Chronic kidney disease    COVID-19    CVA (cerebral vascular accident) ([name])    Family history of breast cancer    Family history of genetic disease carrier    GERD (gastroesophageal reflux disease)    Hx of adenomatous colonic polyps 08/30/2016   Hypercholesterolemia    Hypertension    Incontinence    Osteoarthritis    Pneumonia    Stroke [facility])     Past Surgical History:  Procedure Laterality Date   ABDOMINAL HYSTERECTOMY     CHOLECYSTECTOMY     COLONOSCOPY     LAPAROTOMY N/A 08/09/2017   Procedure: LAPAROTOMY WITH BILATERAL SALPINGOOPHERECTOMY;  Surgeon: Christophe Louis, MD;  Location: [facility] [facility];  Service: Gynecology;  Laterality: N/A;   NISSEN FUNDOPLICATION  7035   Complicated by perforated esophagus and prolonged critical care illness   TRACHEOSTOMY  1998   UPPER GASTROINTESTINAL ENDOSCOPY      Prior to Admission medications   Medication Sig Start Date End Date Taking? Authorizing Provider  cetirizine (ZYRTEC) 10 MG tablet Take 10 mg by mouth at bedtime.    Yes [provider]  famotidine (PEPCID) 40 MG tablet Take 40 mg by mouth daily.   Yes [provider]  irbesartan (AVAPRO) 75 MG tablet Take 1 tablet (75 mg total) by mouth daily. 08/31/20  Yes Richardo Priest, MD  montelukast (SINGULAIR) 10 MG tablet Take 10 mg by mouth at bedtime.   Yes [provider]  oxybutynin (DITROPAN) 5 MG tablet Take 1  tablet by mouth 2 (two) times daily. 07/09/18  Yes [provider]  rOPINIRole (REQUIP) 1 MG tablet Take 1 mg by mouth 2 (two) times daily.   Yes [provider]  acetaminophen (TYLENOL) 500 MG tablet Take 2 tablets (1,000 mg total) by mouth every 8 (eight) hours as needed for moderate pain. 08/10/17   Thurnell Lose, MD  rosuvastatin (CRESTOR) 40 MG tablet Take 40 mg by mouth at bedtime.  09/23/15 08/25/20  [provider]    Current Outpatient Medications  Medication Sig Dispense Refill   cetirizine (ZYRTEC) 10 MG tablet Take 10 mg by mouth at bedtime.      famotidine (PEPCID) 40 MG tablet Take 40 mg by mouth daily.     irbesartan (AVAPRO) 75 MG tablet Take 1 tablet (75 mg total) by mouth daily. 90 tablet 3   montelukast (SINGULAIR) 10 MG tablet Take 10 mg by mouth at bedtime.     oxybutynin (DITROPAN) 5 MG tablet Take 1 tablet by mouth 2 (two) times daily.     rOPINIRole (REQUIP) 1 MG tablet Take 1 mg by mouth 2 (two) times daily.     acetaminophen (TYLENOL) 500 MG tablet Take 2 tablets (1,000 mg total) by mouth every 8 (eight) hours as needed for moderate pain. 30 tablet 0  rosuvastatin (CRESTOR) 40 MG tablet Take 40 mg by mouth at bedtime.      Current Facility-Administered Medications  Medication Dose Route Frequency Provider Last Rate Last Admin   0.9 %  sodium chloride infusion  500 mL Intravenous Once Gatha Mayer, MD        Allergies as of 09/01/2020   (No Known Allergies)    Family History  Problem Relation Age of Onset   Heart disease Mother    Breast cancer Mother        dx in her 7s   Heart disease Father    COPD Father    Breast cancer Daughter 33       PALB2 +   Breast cancer Maternal Aunt 77   Cancer Sister 81       vaginal cancer   Ovarian cancer Sister    Lung disease Brother    COPD Brother    Other Daughter        PALB2 pos    Social History   Socioeconomic History   Marital status: Married    Spouse name: Rolan Bucco    Number of children: 2   Years of education: Not on file   Highest education level: Not on file  Occupational History   Occupation: homemaker  Tobacco Use   Smoking status: Never   Smokeless tobacco: Never  Vaping Use   Vaping Use: Never used  Substance and Sexual Activity   Alcohol use: No   Drug use: No   Sexual activity: Not Currently  Other Topics Concern   Not on file  Social History Narrative   Marital Status: Married Dentist)   Children: Daughters (Twins)    Pets: Tax adviser)   Living Situation: Lives with husband    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  Physical Exam: Vital signs in last 24 hours: _0 @   General:   Alert,  Well-developed, well-nourished, pleasant and cooperative in NAD Lungs:  Clear throughout to auscultation.   Heart:  Regular rate and rhythm; no murmurs, clicks, rubs,  or gallops. Abdomen:  Soft, nontender and nondistended. Normal bowel sounds.   Neuro/Psych:  Alert and cooperative. Normal mood and affect. A and O x 3   _1  E. Carlean Purl, MD, [facility] [phone] (pager) 09/01/2020 10:25 AM@

## 2020-09-03 ENCOUNTER — Telehealth: Payer: Self-pay | Admitting: *Deleted

## 2020-09-03 NOTE — Telephone Encounter (Signed)
  Follow up Call-  Call back number 09/01/2020  Post procedure Call Back phone  # [phone]  Permission to leave phone message Yes  Some recent data might be hidden     Patient questions:  Do you have a fever, pain , or abdominal swelling? No. Pain Score  0 *  Have you tolerated food without any problems? Yes.    Have you been able to return to your normal activities? Yes.    Do you have any questions about your discharge instructions: Diet   No. Medications  No. Follow up visit  No.  Do you have questions or concerns about your Care? No.  Actions: * If pain score is 4 or above: No action needed, pain <4. Have you developed a fever since your procedure? no  2.   Have you had an respiratory symptoms (SOB or cough) since your procedure? no  3.   Have you tested positive for COVID 19 since your procedure no  4.   Have you had any family members/close contacts diagnosed with the COVID 19 since your procedure?  no   If yes to any of these questions please route to Joylene John, RN and Joella Prince, RN

## 2020-09-04 ENCOUNTER — Ambulatory Visit (HOSPITAL_BASED_OUTPATIENT_CLINIC_OR_DEPARTMENT_OTHER)
Admission: RE | Admit: 2020-09-04 | Discharge: 2020-09-04 | Disposition: A | Discharge status: Home / Self Care | Payer: Medicare PPO | Source: Ambulatory Visit | Attending: Cardiology | Admitting: Cardiology

## 2020-09-04 ENCOUNTER — Other Ambulatory Visit: Payer: Self-pay

## 2020-09-04 ENCOUNTER — Ambulatory Visit (HOSPITAL_COMMUNITY)
Admission: RE | Admit: 2020-09-04 | Discharge: 2020-09-04 | Disposition: A | Discharge status: Home / Self Care | Payer: Medicare PPO | Source: Ambulatory Visit | Attending: Cardiology | Admitting: Cardiology

## 2020-09-04 ENCOUNTER — Telehealth: Payer: Self-pay

## 2020-09-04 DIAGNOSIS — E785 Hyperlipidemia, unspecified: Secondary | ICD-10-CM | POA: Insufficient documentation

## 2020-09-04 DIAGNOSIS — I10 Essential (primary) hypertension: Secondary | ICD-10-CM | POA: Insufficient documentation

## 2020-09-04 DIAGNOSIS — R0989 Other specified symptoms and signs involving the circulatory and respiratory systems: Secondary | ICD-10-CM

## 2020-09-04 DIAGNOSIS — J984 Other disorders of lung: Secondary | ICD-10-CM | POA: Diagnosis not present

## 2020-09-04 DIAGNOSIS — R0600 Dyspnea, unspecified: Secondary | ICD-10-CM | POA: Diagnosis present

## 2020-09-04 DIAGNOSIS — R0602 Shortness of breath: Secondary | ICD-10-CM

## 2020-09-04 LAB — ECHOCARDIOGRAM COMPLETE
Area-P 1/2: 4.49 cm2
S' Lateral: 2 cm
Single Plane A4C EF: 66.2 %

## 2020-09-04 MED ORDER — ASPIRIN EC 81 MG PO TBEC: 81.0000 mg | DELAYED_RELEASE_TABLET | Freq: Every day | ORAL | 3 refills | Status: AC

## 2020-09-04 NOTE — Telephone Encounter (Signed)
-----   Message from Richardo Priest, MD sent at 09/04/2020 12:02 PM EDT ----- The ultrasound shows mild blockage in her carotid artery not severe and no need for surgery or stent she should be taking aspirin and she is on appropriate treatment with a statin.  We will do a repeat ultrasound in 1 to 2 years.  Generally the indication of asymptomatic is very severe narrowing.

## 2020-09-04 NOTE — Telephone Encounter (Signed)
Spoke with patient regarding results and recommendation.  Patient verbalizes understanding and is agreeable to plan of care. Advised patient to call back with any issues or concerns.  

## 2020-09-04 NOTE — Progress Notes (Signed)
  Echocardiogram 2D Echocardiogram has been performed.  Kristie Anderson 09/04/2020, 8:39 AM

## 2020-09-04 NOTE — Progress Notes (Signed)
Carotid duplex has been completed.    Results can be found under chart review under CV PROC. 09/04/2020 9:51 AM Taelon Bendorf RVT, RDMS

## 2020-09-07 ENCOUNTER — Telehealth: Payer: Self-pay

## 2020-09-07 NOTE — Telephone Encounter (Signed)
Spoke with patient regarding results and recommendation.  Patient verbalizes understanding and is agreeable to plan of care. Advised patient to call back with any issues or concerns.  

## 2020-09-07 NOTE — Telephone Encounter (Signed)
-----   Message from Berniece Salines, DO sent at 09/07/2020  1:06 PM EDT ----- Your echo showed normal function and valves have some calcium around it but no obstruction.

## 2020-10-20 ENCOUNTER — Other Ambulatory Visit: Payer: Self-pay

## 2020-10-20 ENCOUNTER — Ambulatory Visit: Payer: Medicare PPO | Admitting: Cardiology

## 2020-10-20 ENCOUNTER — Encounter: Payer: Self-pay | Admitting: Cardiology

## 2020-10-20 VITALS — BP 146/68 | HR 66 | Ht 62.0 in | Wt 162.0 lb

## 2020-10-20 DIAGNOSIS — E7849 Other hyperlipidemia: Secondary | ICD-10-CM

## 2020-10-20 DIAGNOSIS — I10 Essential (primary) hypertension: Secondary | ICD-10-CM | POA: Diagnosis not present

## 2020-10-20 DIAGNOSIS — I6522 Occlusion and stenosis of left carotid artery: Secondary | ICD-10-CM

## 2020-10-20 DIAGNOSIS — R0602 Shortness of breath: Secondary | ICD-10-CM | POA: Diagnosis not present

## 2020-10-20 NOTE — Progress Notes (Signed)
Cardiology Office Note:    Date:  10/20/2020   ID:  Mellie Buccellato Elderkin, DOB February 15, 1945, MRN [id number]  PCP:  Ival Bible, MD (Inactive)  Cardiologist:  Shirlee More, MD    Referring MD: No ref. provider found    ASSESSMENT:    1. SOB (shortness of breath)   2. Essential hypertension, benign   3. Other hyperlipidemia   4. Left carotid artery stenosis    PLAN:    In order of problems listed above:  Stable symptoms not severe no findings of heart failure continue current antihypertensive treatment Stable managed by nephrology well-controlled on a single agent ARB Stable lipids at target continue statin Mild asymptomatic plan repeat duplex 1 to 2 years   Next appointment: 1 year her request   Medication Adjustments/Labs and Tests Ordered: Current medicines are reviewed at length with the patient today.  Concerns regarding medicines are outlined above.  No orders of the defined types were placed in this encounter.  No orders of the defined types were placed in this encounter.   Chief Complaint  Patient presents with   Follow-up   Shortness of Breath    History of Present Illness:    AMYE GREGO is a 75 y.o. female with a hx of hypertension hyperlipidemia carotid bruit last seen 08/25/2020 for shortness of breath.  Her BNP level was quite low at 36.  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 She has mild left ICA stenosis. Blood pressure is well controlled on a single agent ARB She continues on appropriate low-dose aspirin and statin lipids at target She has mild shortness of breath we discussed further evaluation high-resolution CT scan PFTs she does not feel it is necessary.  I did tell her that her hiatal hernia may well play a  role.  Echocardiogram performed 09/04/2020 showed normal left ventricular size wall thickness systolic function EF 60 to 65% the right ventricle is normal in size and function with normal pulmonary artery pressure and there is no significant valvular abnormality.  Cerebrovascular duplex performed 09/04/2020 showed 40 to 59% stenosis left internal carotid artery no stenosis in the right vertebral flow was normal bilaterally as well as subclavian flow.  Chest x-ray 06/01/2020 showed no acute cardiopulmonary disease and a moderate size hiatal hernia Past Medical History:  Diagnosis Date   Abdominal hernia 15 years ago   no problems   Allergy    seasonal   Arthritis    knees   Benign gastric polyp - hyperplastic 08/30/2016   bx 07/2016   Chronic kidney disease    COVID-19    CVA (cerebral vascular accident) ([place])    Family history of breast cancer    Family history of genetic disease carrier    GERD (gastroesophageal reflux disease)    Hx of adenomatous colonic polyps 08/30/2016   Hypercholesterolemia    Hypertension    Incontinence    Osteoarthritis    Pneumonia    Stroke [facility])     Past Surgical History:  Procedure Laterality Date   ABDOMINAL HYSTERECTOMY     CHOLECYSTECTOMY     COLONOSCOPY     LAPAROTOMY N/A 08/09/2017   Procedure: LAPAROTOMY WITH BILATERAL SALPINGOOPHERECTOMY;  Surgeon: Christophe Louis, MD;  Location: [place];  Service: Gynecology;  Laterality: N/A;  NISSEN FUNDOPLICATION  3532   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   aspirin EC 81 MG tablet Take 1 tablet (81 mg total) by mouth daily. Swallow whole.   cetirizine (ZYRTEC) 10 MG tablet Take 10 mg by mouth at bedtime.    famotidine  (PEPCID) 40 MG tablet Take 40 mg by mouth daily.   irbesartan (AVAPRO) 75 MG tablet Take 1 tablet (75 mg total) by mouth daily.   montelukast (SINGULAIR) 10 MG tablet Take 10 mg by mouth at bedtime.   oxybutynin (DITROPAN) 5 MG tablet Take 1 tablet by mouth 2 (two) times daily.   rOPINIRole (REQUIP) 1 MG tablet Take 1 mg by mouth 2 (two) times daily.   rosuvastatin (CRESTOR) 40 MG tablet Take 40 mg by mouth at bedtime.      Allergies:   Simvastatin   Social History   Socioeconomic History   Marital status: Married    Spouse name: Rolan Bucco   Number of children: 2   Years of education: Not on file   Highest education level: Not on file  Occupational History   Occupation: homemaker  Tobacco Use   Smoking status: Never   Smokeless tobacco: Never  Vaping Use   Vaping Use: Never used  Substance and Sexual Activity   Alcohol use: No   Drug use: No   Sexual activity: Not Currently  Other Topics Concern   Not on file  Social History Narrative   Marital Status: Married Dentist)   Children: Daughters (Twins)    Pets: Tax adviser)   Living Situation: Lives with husband    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, research (life sciences)   Education: Programmer, systems   Tobacco Use/Exposure:  None    Alcohol Use:  None   Drug Use:  None   Diet:  Regular   Exercise:  Regular   Hobbies: Reading, Environmental consultant   Social Determinants of Radio broadcast assistant Strain: Not on file  Food Insecurity: Not on file  Transportation Needs: Not on file  Physical Activity: Not on file  Stress: Not on file  Social Connections: Not on file     Family History: The patient's family history includes Breast cancer in her mother; Breast cancer (age of onset: 36) in her daughter; Breast cancer (age of onset: 10) in her maternal aunt; COPD in her brother and father; Cancer (age of onset: 36) in her sister; Heart disease in her father and mother; Lung disease in her brother; Other in her daughter; Ovarian cancer in  her sister. ROS:   Please see the history of present illness.    All other systems reviewed and are negative.  EKGs/Labs/Other Studies Reviewed:    The following studies were reviewed today:    Recent Labs: No results found for requested labs within last 8760 hours.  Recent Lipid Panel    Component Value Date/Time   CHOL 169 05/20/2013 0952   TRIG 83 05/20/2013 0952   HDL 61 05/20/2013 0952   CHOLHDL 2.8 05/20/2013 0952   VLDL 17 05/20/2013 0952   LDLCALC 91 05/20/2013 0952    Physical Exam:    VS:  BP (!) 146/68 (BP Location: Left Arm, Patient Position: Sitting, Cuff  Size: Normal)   Pulse 66   Ht 5\' 2"  (1.575 m)   Wt 162 lb (73.5 kg)   SpO2 99%   BMI 29.63 kg/m     Wt Readings from Last 3 Encounters:  10/20/20 162 lb (73.5 kg)  09/01/20 159 lb (72.1 kg)  08/25/20 160 lb 1.3 oz (72.6 kg)     GEN: She appears her age healthy well nourished, well developed in no acute distress HEENT: Normal NECK: No JVD; No carotid bruits LYMPHATICS: No lymphadenopathy CARDIAC: RRR, no murmurs, rubs, gallops RESPIRATORY:  Clear to auscultation without rales, wheezing or rhonchi  ABDOMEN: Soft, non-tender, non-distended MUSCULOSKELETAL:  No edema; No deformity  SKIN: Warm and dry NEUROLOGIC:  Alert and oriented x 3 PSYCHIATRIC:  Normal affect    Signed, Shirlee More, MD  10/20/2020 1:15 PM    [facility]

## 2020-10-20 NOTE — Patient Instructions (Signed)

## 2021-06-24 ENCOUNTER — Other Ambulatory Visit: Payer: Self-pay | Admitting: Family Medicine

## 2021-06-24 DIAGNOSIS — Z1231 Encounter for screening mammogram for malignant neoplasm of breast: Secondary | ICD-10-CM

## 2021-07-19 ENCOUNTER — Ambulatory Visit
Admission: RE | Admit: 2021-07-19 | Discharge: 2021-07-19 | Disposition: A | Discharge status: Home / Self Care | Payer: Medicare PPO | Source: Ambulatory Visit | Attending: Family Medicine | Admitting: Family Medicine

## 2021-07-19 DIAGNOSIS — Z1231 Encounter for screening mammogram for malignant neoplasm of breast: Secondary | ICD-10-CM

## 2021-08-17 ENCOUNTER — Other Ambulatory Visit: Payer: Self-pay | Admitting: Cardiology

## 2021-10-06 ENCOUNTER — Ambulatory Visit: Payer: Medicare PPO | Admitting: Cardiology

## 2021-10-14 IMAGING — MG MM DIGITAL SCREENING BILAT W/ TOMO AND CAD
6 of 12 series · 6 of 36 positions shown · non-contrast
Comparison: Previous exam(s).

CLINICAL DATA: Screening.

EXAM:
DIGITAL SCREENING BILATERAL MAMMOGRAM WITH TOMOSYNTHESIS AND CAD
TECHNIQUE: Bilateral screening digital craniocaudal and mediolateral oblique
mammograms were obtained. Bilateral screening digital breast
tomosynthesis was performed. The images were evaluated with
computer-aided detection.

[L MLO synth-2D]
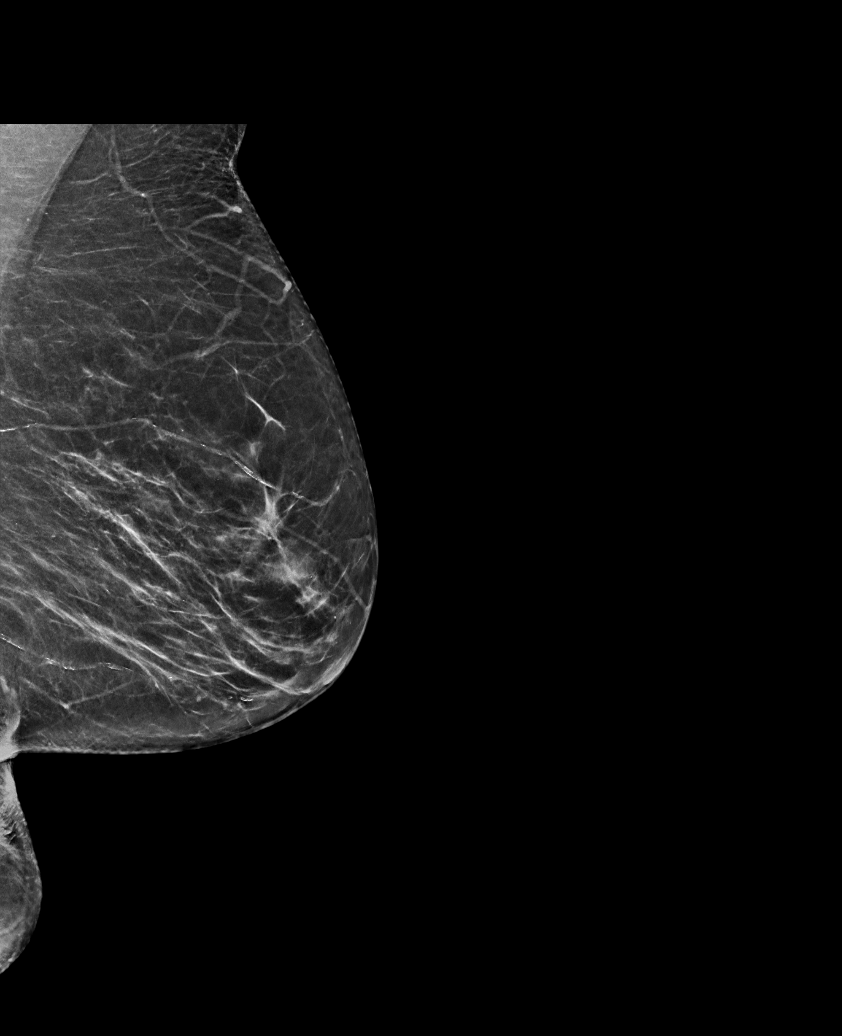

[L CC synth-2D (1 of 2)]
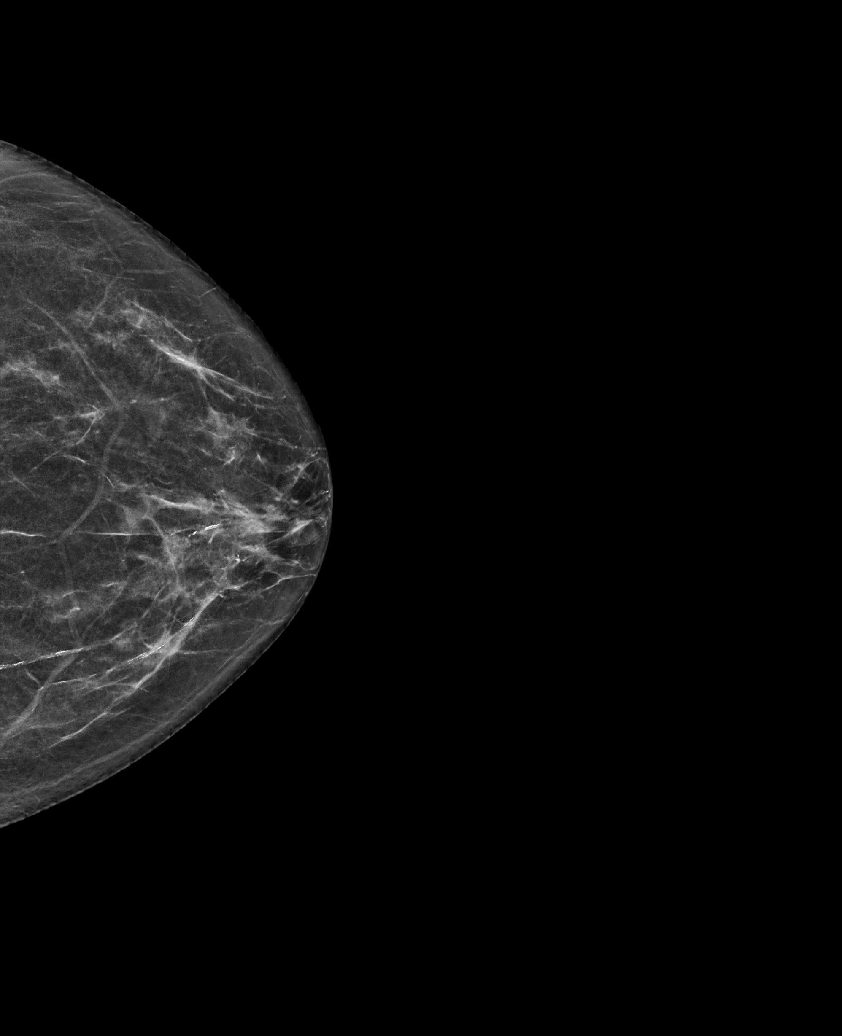

[R CC synth-2D (1 of 2)]
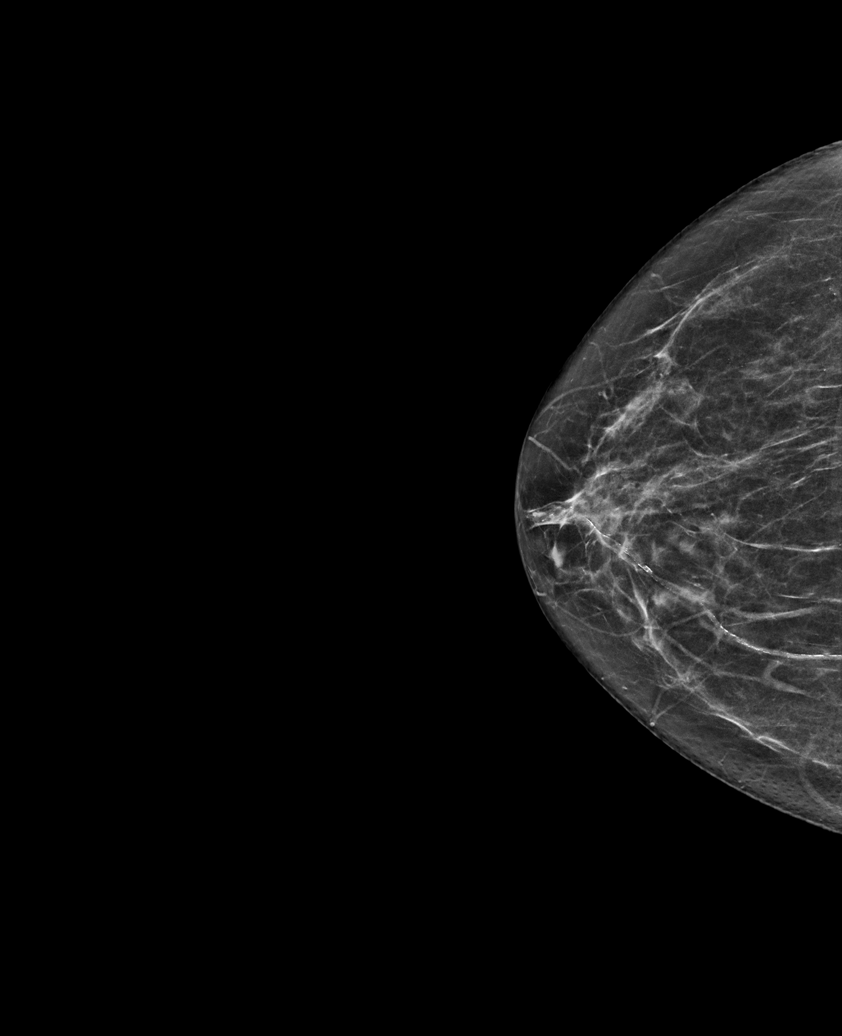

[L CC synth-2D (2 of 2)]
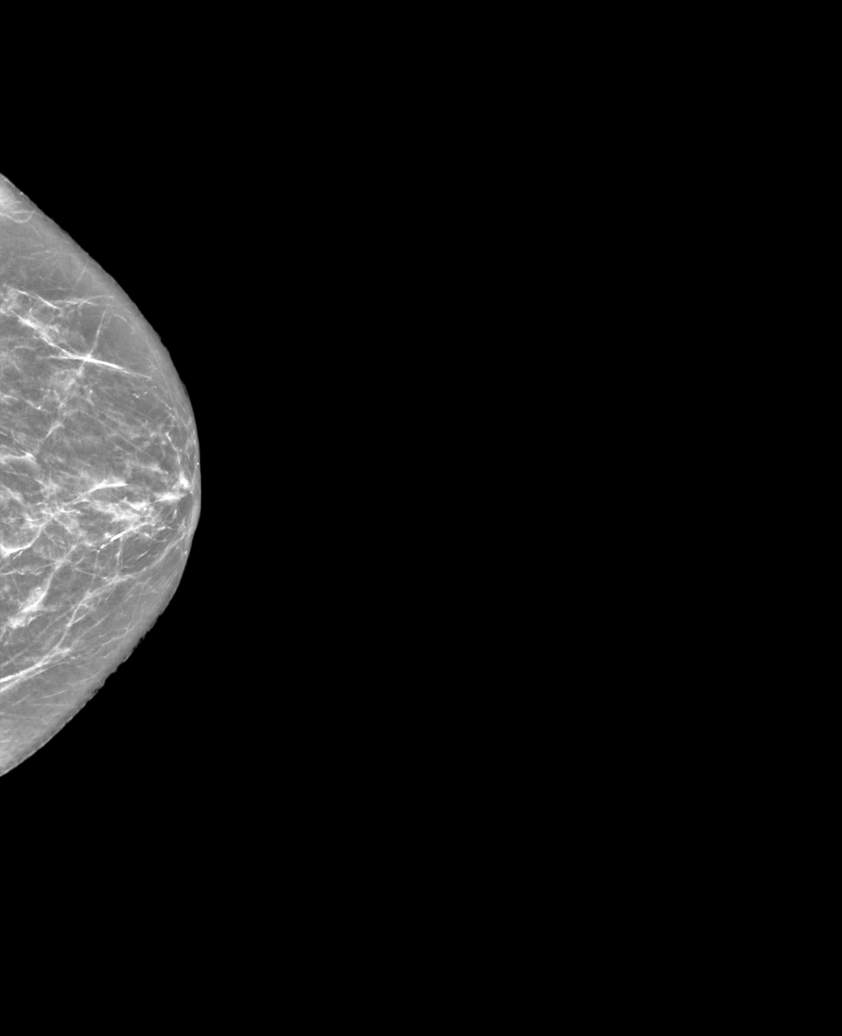

[R CC synth-2D (2 of 2)]
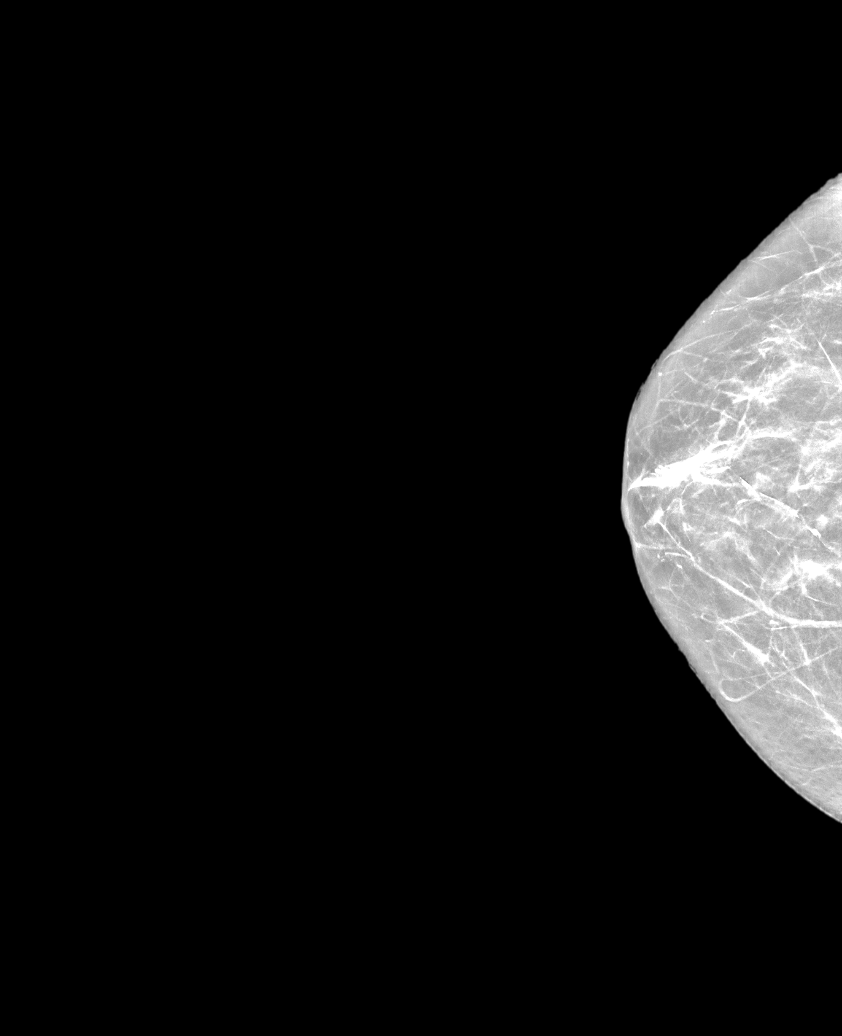

[R MLO synth-2D]
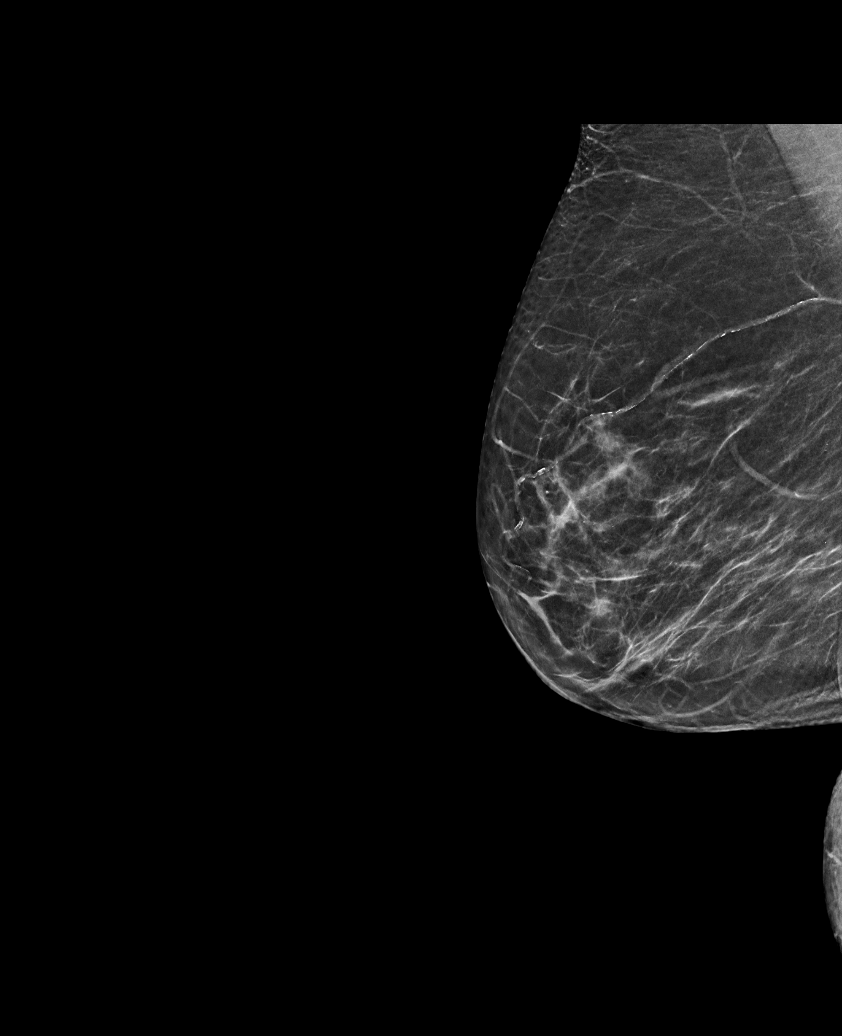

[6 of 36 positions shown; findings below may reference images not displayed]

ACR Breast Density Category b: There are scattered areas of
fibroglandular density.
FINDINGS: There are no findings suspicious for malignancy.
IMPRESSION: No mammographic evidence of malignancy. A result letter of this
screening mammogram will be mailed directly to the patient.

RECOMMENDATION:
Screening mammogram in one year. (Code:51-O-LD2)

BI-RADS CATEGORY  1: Negative.

## 2021-10-24 ENCOUNTER — Emergency Department (HOSPITAL_COMMUNITY)
Admission: EM | Admit: 2021-10-24 | Discharge: 2021-10-24 | Disposition: A | Discharge status: Home / Self Care | Payer: Medicare PPO | Attending: Emergency Medicine | Admitting: Emergency Medicine

## 2021-10-24 ENCOUNTER — Encounter (HOSPITAL_COMMUNITY): Payer: Self-pay

## 2021-10-24 ENCOUNTER — Other Ambulatory Visit: Payer: Self-pay

## 2021-10-24 ENCOUNTER — Emergency Department (HOSPITAL_COMMUNITY): Payer: Medicare PPO

## 2021-10-24 DIAGNOSIS — F341 Dysthymic disorder: Secondary | ICD-10-CM

## 2021-10-24 DIAGNOSIS — K432 Incisional hernia without obstruction or gangrene: Secondary | ICD-10-CM

## 2021-10-24 DIAGNOSIS — M25561 Pain in right knee: Secondary | ICD-10-CM

## 2021-10-24 DIAGNOSIS — Z79899 Other long term (current) drug therapy: Secondary | ICD-10-CM | POA: Insufficient documentation

## 2021-10-24 DIAGNOSIS — Z7982 Long term (current) use of aspirin: Secondary | ICD-10-CM | POA: Insufficient documentation

## 2021-10-24 DIAGNOSIS — I10 Essential (primary) hypertension: Secondary | ICD-10-CM | POA: Insufficient documentation

## 2021-10-24 DIAGNOSIS — W1830XA Fall on same level, unspecified, initial encounter: Secondary | ICD-10-CM | POA: Diagnosis not present

## 2021-10-24 DIAGNOSIS — W19XXXA Unspecified fall, initial encounter: Secondary | ICD-10-CM

## 2021-10-24 HISTORY — DX: Dysthymic disorder: F34.1

## 2021-10-24 HISTORY — DX: Incisional hernia without obstruction or gangrene: K43.2

## 2021-10-24 MED ORDER — OXYCODONE HCL 5 MG PO TABS: 2.5000 mg | ORAL_TABLET | ORAL | 0 refills | Status: AC | PRN

## 2021-10-24 MED ORDER — HYDROMORPHONE HCL 1 MG/ML IJ SOLN
0.2500 mg | Freq: Once | INTRAMUSCULAR | Status: AC
  Administered 2021-10-24: 0.25 mg via INTRAMUSCULAR
  Filled 2021-10-24: qty 1

## 2021-10-24 MED ORDER — ACETAMINOPHEN 500 MG PO TABS: 1000.0000 mg | ORAL_TABLET | Freq: Four times a day (QID) | ORAL | Status: DC | PRN

## 2021-10-24 MED ORDER — IBUPROFEN 400 MG PO TABS
400.0000 mg | ORAL_TABLET | Freq: Once | ORAL | Status: AC
  Administered 2021-10-24: 400 mg via ORAL
  Filled 2021-10-24: qty 1

## 2021-10-24 MED ORDER — OXYCODONE HCL 5 MG PO TABS
2.5000 mg | ORAL_TABLET | Freq: Once | ORAL | Status: DC
  Filled 2021-10-24: qty 1

## 2021-10-24 NOTE — Progress Notes (Addendum)
Orthopedic Tech Progress Note Patient Details:  Kristie Anderson 05-19-1945 [id number]  Ortho Devices Type of Ortho Device: Knee Immobilizer Ortho Device/Splint Location: rle Ortho Device/Splint Interventions: Ordered, Application, Adjustment  Pt refused crutches. Ed staff arranging for a walker. Pt says has a cane at home. Post Interventions Patient Tolerated: Well Instructions Provided: Care of device, Adjustment of device  Karolee Stamps 10/24/2021, 8:36 PM

## 2021-10-24 NOTE — ED Provider Notes (Signed)
[facility] EMERGENCY DEPARTMENT Provider Note   CSN: [id number] Arrival date & time: 10/24/21  1021     History  Fall  Kristie Anderson is a 76 y.o. female with history CVA, status post Nissen fundoplication, history of tracheostomy, GERD, HTN, BRCA gene positive presents with fall.  Patient presents from church while experiencing mechanical fall going down the aisle, tripped over the carpet.  No LOC or head trauma.  Has pain and swelling to the right knee, rated 9 out of 10.  States she had an history of an old stroke that did affect that right leg and she cannot lift the foot as well as the other side, this is her baseline and is not new.  She endorses no new numbness or tingling or weakness on the right side.  HPI     Home Medications Prior to Admission medications   Medication Sig Start Date End Date Taking? Authorizing Provider  oxyCODONE (ROXICODONE) 5 MG immediate release tablet Take 0.5 tablets (2.5 mg total) by mouth every 4 (four) hours as needed for up to 3 days for severe pain. 10/24/21 10/27/21 Yes Naasz, Hayley N, MD  acetaminophen (TYLENOL) 500 MG tablet Take 2 tablets (1,000 mg total) by mouth every 8 (eight) hours as needed for moderate pain. 08/10/17   Varnado, Evelyn, MD  aspirin EC 81 MG tablet Take 1 tablet (81 mg total) by mouth daily. Swallow whole. 09/04/20   Munley, Brian J, MD  cetirizine (ZYRTEC) 10 MG tablet Take 10 mg by mouth at bedtime.     [provider]  famotidine (PEPCID) 40 MG tablet Take 40 mg by mouth daily.    [provider]  irbesartan (AVAPRO) 75 MG tablet Take 1 tablet by mouth once daily 08/17/21   Munley, Brian J, MD  montelukast (SINGULAIR) 10 MG tablet Take 10 mg by mouth at bedtime.    [provider]  oxybutynin (DITROPAN) 5 MG tablet Take 1 tablet by mouth 2 (two) times daily. 07/09/18   [provider]  rOPINIRole (REQUIP) 1 MG tablet Take 1 mg by mouth 2 (two) times daily.    [provider]  rosuvastatin (CRESTOR) 40 MG tablet Take 40 mg by mouth at bedtime.  09/23/15 10/20/20  [provider]      Allergies    Simvastatin    Review of Systems   Review of Systems Review of systems negative for head trauma, LOC.  A 10 point review of systems was performed and is negative unless otherwise reported in HPI.  Physical Exam Updated Vital Signs BP 136/70   Pulse 71   Temp 98.2 F (36.8 C)   Resp 16   SpO2 97%  Physical Exam General: Normal appearing female, lying in bed.  HEENT: PERRLA, Sclera anicteric, MMM, trachea midline. Cardiology: RRR, no murmurs/rubs/gallops. BL radial and DP pulses equal bilaterally.  Resp: Normal respiratory rate and effort. CTAB, no wheezes, rhonchi, crackles.  Abd: Soft, non-tender, non-distended. No rebound tenderness or guarding.  GU: Deferred. MSK: Right knee with effusion and swelling, generalized tenderness palpation.  Right calf muscle is spasmed and very tender to touch, limiting exam. No ankle pain, swelling, tenderness. ROM ankle limited d/t pain in calf. Achilles tendon palpated, no tenderness. Patella in anatomic location. No femur tenderness, deformities; no pelvic instability or tenderness.  Skin: warm, dry. No rashes or lesions. Neuro: A&Ox4, CNs II-XII grossly intact. MAEs. Sensation grossly intact.  Psych: Normal mood and affect.   ED   Results / Procedures / Treatments   Labs (all labs ordered are listed, but only abnormal results are displayed) Labs Reviewed - No data to display  EKG None  Radiology DG Tibia/Fibula Right  Result Date: 10/24/2021 CLINICAL DATA:  Fall this morning. History of previous injury to right ankle. EXAM: RIGHT TIBIA AND FIBULA - 2 VIEW; RIGHT KNEE - COMPLETE 4+ VIEW COMPARISON:  None Available. FINDINGS: There is no evidence of fracture, dislocation, or knee joint effusion. Tricompartmental mild-to-moderate osteoarthritis of the knee joint. An intramedullary sclerotic lesion  located within the distal femoral diaphysis extending into the metaphysis and both condyles demonstrates a serpiginous sclerotic border. In the proximal tibial metaphysis, there is a faint intramedullary sclerotic lesion. No endosteal scalloping is noted with either lesion. Surgical fixation screws are noted in the medial midfoot at the base of the great toe. Vascular calcifications are noted in the distal right lower leg. Soft tissues are otherwise unremarkable. IMPRESSION: 1. No acute osseous abnormality. 2. Tricompartmental osteoarthritis of the knee joint. 3. Intramedullary sclerotic lesions in the distal femur and proximal tibial metaphysis are favored to be benign, with main differential including osteonecrosis and enchondromas. Electronically Signed   By: Laura  Parra M.D.   On: 10/24/2021 11:55   DG Knee Complete 4 Views Right  Result Date: 10/24/2021 CLINICAL DATA:  Fall this morning. History of previous injury to right ankle. EXAM: RIGHT TIBIA AND FIBULA - 2 VIEW; RIGHT KNEE - COMPLETE 4+ VIEW COMPARISON:  None Available. FINDINGS: There is no evidence of fracture, dislocation, or knee joint effusion. Tricompartmental mild-to-moderate osteoarthritis of the knee joint. An intramedullary sclerotic lesion located within the distal femoral diaphysis extending into the metaphysis and both condyles demonstrates a serpiginous sclerotic border. In the proximal tibial metaphysis, there is a faint intramedullary sclerotic lesion. No endosteal scalloping is noted with either lesion. Surgical fixation screws are noted in the medial midfoot at the base of the great toe. Vascular calcifications are noted in the distal right lower leg. Soft tissues are otherwise unremarkable. IMPRESSION: 1. No acute osseous abnormality. 2. Tricompartmental osteoarthritis of the knee joint. 3. Intramedullary sclerotic lesions in the distal femur and proximal tibial metaphysis are favored to be benign, with main differential including  osteonecrosis and enchondromas. Electronically Signed   By: Laura  Parra M.D.   On: 10/24/2021 11:55    Procedures Procedures    Medications Ordered in ED Medications  acetaminophen (TYLENOL) tablet 1,000 mg (has no administration in time range)  ibuprofen (ADVIL) tablet 400 mg (400 mg Oral Given 10/24/21 1657)  HYDROmorphone (DILAUDID) injection 0.25 mg (0.25 mg Intramuscular Given 10/24/21 1657)  HYDROmorphone (DILAUDID) injection 0.25 mg (0.25 mg Intramuscular Given 10/24/21 1854)    ED Course/ Medical Decision Making/ A&P                          Medical Decision Making Amount and/or Complexity of Data Reviewed Radiology: ordered. Decision-making details documented in ED Course.  Risk OTC drugs. Prescription drug management.  Patient overall well-appearing, in her USOH except for the knee pain/tenderness. Exam w/ no bony deformities but with knee effusion.  Consider fracture of distal femur or proximal tibia fibula.  Consider dislocation. Muscles are spasming and difficult to examine but no ruptured achilles. Possible she has a ligamentous or meniscal injury, but will need pain control. XRs show no fracture/dislocation, just benign lesions and OA.   I have personally reviewed and interpreted all imaging.   Clinical Course   as of 10/24/21 1955  Sun Oct 24, 2021  1520 DG Tibia/Fibula Right 1. No acute osseous abnormality. 2. Tricompartmental osteoarthritis of the knee joint. 3. Intramedullary sclerotic lesions in the distal femur and proximal tibial metaphysis are favored to be benign, with main differential including osteonecrosis and enchondromas. [HN]  1836 Patient reevaluated. States the IM 0.25 mg dilaudid helped, she no longer feels like she is going to pass out from pain. [HN]  1945 Patient states she feels better after pain medicine and would like to be discharged. No fracture/dislocation requiring acute intervention or admission. Likely w/ traumatic knee effusion plus or  minus possible ligamentous/meniscal injury. Will place patient in a knee immobilizer for comfort and give walker to help get around. Patient states she already has an orthopedic surgeon and will have them squeeze her in for an appointment tomorrow. Patient is given discharge instructions/return precautions. Reports understanding. Will be given a few pills of oxycodone to get her to her orthopedic appointment. [HN]    Clinical Course User Index [HN] Naasz, Hayley N, MD          Final Clinical Impression(s) / ED Diagnoses Final diagnoses:  Acute pain of right knee  Fall, initial encounter    Rx / DC Orders ED Discharge Orders          Ordered    oxyCODONE (ROXICODONE) 5 MG immediate release tablet  Every 4 hours PRN        10/24/21 1954             This note was created using dictation software, which may contain spelling or grammatical errors.    Naasz, Hayley N, MD 10/24/21 1956  

## 2021-10-24 NOTE — ED Triage Notes (Signed)
Patient arrived by [employer] from church. Patient had mechanical fall going down the isle. Patient no loc. Has pain and swelling to right knee.

## 2021-10-24 NOTE — Discharge Instructions (Signed)
Thank you for coming to [facility] Emergency Department. You were seen for a fall. We did an exam, labs, and X-rays, and these showed no acute findings. You have a knee effusion and pain with spasming muscles. Please alternate taking tylenol and ibuprofen for the pain.  I will prescribe a few days of oxycodone to take every 4-6 hours as needed. Please follow up with your orthopedic surgeon within 1 week. If you cannot get an appointment with them, please make an appointment with your primary care physician.  Do not hesitate to return to the ED or call 911 if you experience: -Worsening symptoms -Numbness/tingling in the right leg or foot -Pain so severe you cannot manage it at home -Lightheadedness, passing out -Fevers/chills -Anything else that concerns you

## 2021-10-24 NOTE — Care Management (Signed)
ED CM received call concerning patient needing a rolling walker. Explained that we can place order with [place], and walker would be delivered to patient's home tomorrow  patient is agreeable. Order placed patient sent tracking information for walker.

## 2021-10-25 ENCOUNTER — Other Ambulatory Visit: Payer: Self-pay | Admitting: Orthopedic Surgery

## 2021-10-25 DIAGNOSIS — S82141A Displaced bicondylar fracture of right tibia, initial encounter for closed fracture: Secondary | ICD-10-CM

## 2021-11-05 ENCOUNTER — Ambulatory Visit
Admission: RE | Admit: 2021-11-05 | Discharge: 2021-11-05 | Disposition: A | Discharge status: Home / Self Care | Payer: Medicare PPO | Source: Ambulatory Visit | Attending: Orthopedic Surgery | Admitting: Orthopedic Surgery

## 2021-11-05 DIAGNOSIS — S82141A Displaced bicondylar fracture of right tibia, initial encounter for closed fracture: Secondary | ICD-10-CM

## 2021-11-09 ENCOUNTER — Ambulatory Visit: Payer: Medicare PPO | Admitting: Cardiology

## 2021-11-12 ENCOUNTER — Other Ambulatory Visit: Payer: Medicare PPO

## 2021-11-22 ENCOUNTER — Other Ambulatory Visit: Payer: Self-pay

## 2021-11-22 ENCOUNTER — Telehealth: Payer: Self-pay | Admitting: Cardiology

## 2021-11-22 MED ORDER — IRBESARTAN 75 MG PO TABS: 75.0000 mg | ORAL_TABLET | Freq: Every day | ORAL | 3 refills | Status: AC

## 2021-11-22 NOTE — Telephone Encounter (Signed)
*  STAT* If patient is at the pharmacy, call can be transferred to refill team.   1. Which medications need to be refilled? (please list name of each medication and dose if known) irbesartan (AVAPRO) 75 MG tablet  2. Which pharmacy/location (including street and city if local pharmacy) is medication to be sent to?  [place], [address]  3. Do they need a 30 day or 90 day supply? [place]

## 2021-12-30 ENCOUNTER — Ambulatory Visit: Payer: Non-veteran care | Attending: Cardiology | Admitting: Cardiology

## 2021-12-30 ENCOUNTER — Encounter: Payer: Self-pay | Admitting: Cardiology

## 2021-12-30 VITALS — BP 140/68 | HR 87 | Ht 62.0 in | Wt 151.6 lb

## 2021-12-30 DIAGNOSIS — M7989 Other specified soft tissue disorders: Secondary | ICD-10-CM

## 2021-12-30 DIAGNOSIS — R0602 Shortness of breath: Secondary | ICD-10-CM

## 2021-12-30 DIAGNOSIS — I10 Essential (primary) hypertension: Secondary | ICD-10-CM | POA: Diagnosis not present

## 2021-12-30 DIAGNOSIS — E7849 Other hyperlipidemia: Secondary | ICD-10-CM

## 2021-12-30 NOTE — Progress Notes (Signed)
Cardiology Office Note:    Date:  12/30/2021   ID:  Jordon Bourquin Newland, DOB 02-02-1945, MRN [id number]  PCP:  Harl Bowie, MD  Cardiologist:  Shirlee More, MD    Referring MD: No ref. provider found    ASSESSMENT:    1. SOB (shortness of breath)   2. Essential hypertension, benign   3. Other hyperlipidemia   4. Right leg swelling    PLAN:    In order of problems listed above:  No recurrence of shortness of breath no indication of heart failure Hypertension well-controlled continue her ARB Continue his statin carotid disease At risk for DVT will check duplex in my office   Next appointment: 9 months   Medication Adjustments/Labs and Tests Ordered: Current medicines are reviewed at length with the patient today.  Concerns regarding medicines are outlined above.  No orders of the defined types were placed in this encounter.  No orders of the defined types were placed in this encounter.   Chief Complaint  Patient presents with   Follow-up    After cardiac testing for shortness of breath    History of Present Illness:    Kristie Anderson is a 76 y.o. female with a hx of hypertension and stage III CKD hyperlipidemia 40 to 59% stenosis right internal carotid artery and previous evaluation of shortness of breath last seen 09/30/2020.  She had no findings of heart failure proBNP level was quite low and echocardiogram showed normal left ventricular size and function ejection fraction normal right heart size function and normal pulmonary artery pressure 09/04/2020.  Compliance with diet, lifestyle and medications: Yes  Since last seen she had a knee injury she has been in a knee immobilizer since 10/24/2021 and is getting ready to initiate physical therapy Decades ago she had DVT in the setting of oral contraceptives She is having swelling and some discomfort in the right leg in the calf We will check a venous duplex in my office.  She is at risk for DVT Otherwise she has  done well and is not short of breath no edema on the left leg orthopnea chest pain palpitations syncope Past Medical History:  Diagnosis Date   Abdominal hernia 15 years ago   no problems   Allergy    seasonal   Arthritis    knees   Benign gastric polyp - hyperplastic 08/30/2016   bx 07/2016   Chronic kidney disease    COVID-19    CVA (cerebral vascular accident) ([place])    Family history of breast cancer    Family history of genetic disease carrier    GERD (gastroesophageal reflux disease)    Hx of adenomatous colonic polyps 08/30/2016   Hypercholesterolemia    Hypertension    Incontinence    Osteoarthritis    Pneumonia    Stroke [facility])     Past Surgical History:  Procedure Laterality Date   ABDOMINAL HYSTERECTOMY     CHOLECYSTECTOMY     COLONOSCOPY     LAPAROTOMY N/A 08/09/2017   Procedure: LAPAROTOMY WITH BILATERAL SALPINGOOPHERECTOMY;  Surgeon: Christophe Louis, MD;  Location: [facility];  Service: Gynecology;  Laterality: N/A;   NISSEN FUNDOPLICATION  2536   Complicated by perforated esophagus and prolonged critical care illness   TRACHEOSTOMY  1998   UPPER GASTROINTESTINAL ENDOSCOPY      Current Medications: Current Meds  Medication Sig   acetaminophen (TYLENOL) 500 MG tablet Take 2 tablets (1,000 mg total) by mouth every 8 (eight) hours as needed  for moderate pain.   aspirin EC 81 MG tablet Take 1 tablet (81 mg total) by mouth daily. Swallow whole.   cetirizine (ZYRTEC) 10 MG tablet Take 10 mg by mouth at bedtime.    famotidine (PEPCID) 40 MG tablet Take 40 mg by mouth daily.   irbesartan (AVAPRO) 75 MG tablet Take 1 tablet (75 mg total) by mouth daily.   montelukast (SINGULAIR) 10 MG tablet Take 10 mg by mouth at bedtime.   oxybutynin (DITROPAN) 5 MG tablet Take 1 tablet by mouth 2 (two) times daily.   rOPINIRole (REQUIP) 1 MG tablet Take 1 mg by mouth 2 (two) times daily.   rosuvastatin (CRESTOR) 40 MG tablet Take 40 mg by mouth at bedtime.      Allergies:   Simvastatin    Social History   Socioeconomic History   Marital status: Married    Spouse name: Rolan Bucco   Number of children: 2   Years of education: Not on file   Highest education level: Not on file  Occupational History   Occupation: homemaker  Tobacco Use   Smoking status: Never   Smokeless tobacco: Never  Vaping Use   Vaping Use: Never used  Substance and Sexual Activity   Alcohol use: No   Drug use: No   Sexual activity: Not Currently  Other Topics Concern   Not on file  Social History Narrative   Marital Status: Married Dentist)   Children: Daughters (Twins)    Pets: Tax adviser)   Living Situation: Lives with husband    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, research (life sciences)   Education: Programmer, systems   Tobacco Use/Exposure:  None    Alcohol Use:  None   Drug Use:  None   Diet:  Regular   Exercise:  Regular   Hobbies: Reading, Environmental consultant   Social Determinants of Radio broadcast assistant Strain: Not on file  Food Insecurity: Not on file  Transportation Needs: Not on file  Physical Activity: Not on file  Stress: Not on file  Social Connections: Not on file     Family History: The patient's family history includes Breast cancer in her mother; Breast cancer (age of onset: 63) in her daughter; Breast cancer (age of onset: 79) in her maternal aunt; COPD in her brother and father; Cancer (age of onset: 64) in her sister; Heart disease in her father and mother; Lung disease in her brother; Other in her daughter; Ovarian cancer in her sister. ROS:   Please see the history of present illness.    All other systems reviewed and are negative.  EKGs/Labs/Other Studies Reviewed:    The following studies were reviewed today:  EKG:  EKG ordered today and personally reviewed.  The ekg ordered today demonstrates sinus rhythm normal EKG  Recent Labs: No results found for requested labs within last 365 days.  Recent Lipid Panel    Component Value Date/Time   CHOL 169 05/20/2013  0952   TRIG 83 05/20/2013 0952   HDL 61 05/20/2013 0952   CHOLHDL 2.8 05/20/2013 0952   VLDL 17 05/20/2013 0952   LDLCALC 91 05/20/2013 0952    Physical Exam:    VS:  BP (!) 140/68 (BP Location: Left Arm, Patient Position: Sitting, Cuff Size: Normal)   Pulse 87   Ht '5\' 2"'$  (1.575 m)   Wt 151 lb 9.6 oz (68.8 kg)   SpO2 99%   BMI 27.73 kg/m     Wt Readings from Last 3 Encounters:  12/30/21 151 lb 9.6 oz (68.8 kg)  10/20/20 162 lb (73.5 kg)  09/01/20 159 lb (72.1 kg)     GEN:  Well nourished, well developed in no acute distress HEENT: Normal NECK: No JVD; No carotid bruits LYMPHATICS: No lymphadenopathy CARDIAC: RRR, no murmurs, rubs, gallops RESPIRATORY:  Clear to auscultation without rales, wheezing or rhonchi  ABDOMEN: Soft, non-tender, non-distended MUSCULOSKELETAL:  No left lower extremity edema; No deformity she has 2+ edema right lower extremity with fullness in the calf but no palpable cord SKIN: Warm and dry NEUROLOGIC:  Alert and oriented x 3 PSYCHIATRIC:  Normal affect    Signed, Shirlee More, MD  12/30/2021 2:25 PM    [place]

## 2021-12-30 NOTE — Patient Instructions (Signed)
Medication Instructions:  Your physician recommends that you continue on your current medications as directed. Please refer to the Current Medication list given to you today.  *If you need a refill on your cardiac medications before your next appointment, please call your pharmacy*   Lab Work: None If you have labs (blood work) drawn today and your tests are completely normal, you will receive your results only by: [place] (if you have MyChart) OR A paper copy in the mail If you have any lab test that is abnormal or we need to change your treatment, we will call you to review the results.   Testing/Procedures: Ultrasound of the lower extremities   Follow-Up: At [facility], you and your health needs are our priority.  As part of our continuing mission to provide you with exceptional heart care, we have created designated Provider Care Teams.  These Care Teams include your primary Cardiologist (physician) and Advanced Practice Providers (APPs -  Physician Assistants and Nurse Practitioners) who all work together to provide you with the care you need, when you need it.  We recommend signing up for the patient portal called "MyChart".  Sign up information is provided on this After Visit Summary.  MyChart is used to connect with patients for Virtual Visits (Telemedicine).  Patients are able to view lab/test results, encounter notes, upcoming appointments, etc.  Non-urgent messages can be sent to your provider as well.   To learn more about what you can do with MyChart, go to [email or web address].    Your next appointment:   1 year(s)  The format for your next appointment:   In Person  Provider:   Shirlee More, MD    Other Instructions None  Important Information About [place]

## 2022-01-25 ENCOUNTER — Ambulatory Visit: Payer: Non-veteran care | Attending: Cardiology

## 2022-01-25 DIAGNOSIS — M7989 Other specified soft tissue disorders: Secondary | ICD-10-CM | POA: Diagnosis not present

## 2022-01-25 DIAGNOSIS — I10 Essential (primary) hypertension: Secondary | ICD-10-CM

## 2022-01-25 DIAGNOSIS — E7849 Other hyperlipidemia: Secondary | ICD-10-CM | POA: Diagnosis not present

## 2022-01-25 DIAGNOSIS — R0602 Shortness of breath: Secondary | ICD-10-CM

## 2022-04-06 ENCOUNTER — Telehealth: Payer: Self-pay | Admitting: Cardiology

## 2022-04-06 ENCOUNTER — Other Ambulatory Visit: Payer: Self-pay

## 2022-04-06 NOTE — Telephone Encounter (Signed)
Called patient and she reported that she had broken her leg and she has just started walking again. She has noticed that when she turns or bends over to pick up something she becomes a little light headed and dizzy. She was unable to take her blood pressure because her blood pressure cuff was broken. Spoke with Dr. Bettina Gavia regarding her symptoms and he recommended that she follow up with her PCP. Informed the patient of Dr. Joya Gaskins recommendation and she verbalized understanding and had no further questions at this time.

## 2022-04-06 NOTE — Telephone Encounter (Signed)
STAT if patient feels like he/she is going to faint   Are you dizzy now?  A little lightheadedness  Do you feel faint or have you passed out?  No   Do you have any other symptoms?  No   Have you checked your HR and BP (record if available)?  BP cuff doesn't work

## 2022-05-12 ENCOUNTER — Other Ambulatory Visit (INDEPENDENT_AMBULATORY_CARE_PROVIDER_SITE_OTHER): Payer: Medicare PPO

## 2022-05-12 ENCOUNTER — Encounter: Payer: Self-pay | Admitting: Orthopedic Surgery

## 2022-05-12 ENCOUNTER — Ambulatory Visit (INDEPENDENT_AMBULATORY_CARE_PROVIDER_SITE_OTHER): Payer: Medicare PPO | Admitting: Orthopedic Surgery

## 2022-05-12 DIAGNOSIS — M79671 Pain in right foot: Secondary | ICD-10-CM

## 2022-05-12 DIAGNOSIS — M6701 Short Achilles tendon (acquired), right ankle: Secondary | ICD-10-CM | POA: Diagnosis not present

## 2022-05-12 NOTE — Progress Notes (Signed)
Office Visit Note   Patient: Kristie Anderson           Date of Birth: 11-02-1945           MRN: [id number] Visit Date: 05/12/2022              Requested by: Drucilla Chalet, MD [address] [facility] [place],  [place] [address] PCP: Drucilla Chalet, MD  Chief Complaint  Patient presents with   Right Foot - Pain      HPI: Patient is a 77 year old woman who presents with forefoot pain.  She states the right forefoot has tingling and burning at the end of the day.  Pain worse beneath the first metatarsal head on the right.  Patient states she has been nonweightbearing since she had a fracture from a fall at church in October.  Assessment & Plan: Visit Diagnoses:  1. Pain in right foot   2. Contracture of right Achilles tendon     Plan: Patient was given instructions and demonstrated Achilles stretching to unload pressure from the metatarsal heads.  Recommended a stiff soled new balance walking sneaker.  Follow-Up Instructions: Return if symptoms worsen or fail to improve.   Ortho Exam  Patient is alert, oriented, no adenopathy, well-dressed, normal affect, normal respiratory effort. Examination patient has atrophy of the soft tissue of the plantar aspect of her foot she has essentially skin and bones beneath the metatarsal heads with callus beneath the first metatarsal head.  With her knee extended she has dorsiflexion 10 degrees short of neutral with Achilles contracture.  She does have a pronated valgus pes planus secondary to chronic posterior tibial tendon sufficiency.  There is no tenderness to palpation along the posterior tibial tendon no impingement pain laterally.  Imaging: XR Foot Complete Right  Result Date: 05/12/2022 Three-view radiographs of the right foot shows previous fusion base of the first metatarsal with a long second metatarsal and a healed stress fracture in the second metatarsal.  There is osteopenia.  No images are attached to the  encounter.  Labs: Lab Results  Component Value Date   REPTSTATUS 01/21/2011 FINAL 01/19/2011   CULT  01/19/2011    Multiple bacterial morphotypes present, none predominant. Suggest appropriate recollection if clinically indicated.     Lab Results  Component Value Date   ALBUMIN 3.8 07/31/2017   ALBUMIN 3.0 (L) 02/28/2014   ALBUMIN 4.1 05/20/2013    No results found for: "MG" No results found for: "VD25OH"  No results found for: "PREALBUMIN"    Latest Ref Rng & Units 08/10/2017    6:00 AM 07/31/2017   12:15 PM 02/28/2014   11:54 AM  CBC EXTENDED  WBC 4.0 - 10.5 K/uL 13.9  9.1  10.6   RBC 3.87 - 5.11 MIL/uL 3.69  4.64  4.03   Hemoglobin 12.0 - 15.0 g/dL 21.3  08.6  57.8   HCT 36.0 - 46.0 % 34.6  43.7  37.4   Platelets 150 - 400 K/uL 181  255  395   NEUT# 1.7 - 7.7 K/uL   8.6   Lymph# 0.7 - 4.0 K/uL   0.9      There is no height or weight on file to calculate BMI.  Orders:  Orders Placed This Encounter  Procedures   XR Foot Complete Right   No orders of the defined types were placed in this encounter.    Procedures: No procedures performed  Clinical Data: No additional findings.  ROS:  All other systems  negative, except as noted in the HPI. Review of Systems  Objective: Vital Signs: There were no vitals taken for this visit.  Specialty Comments:  No specialty comments available.  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   Incisional hernia 10/24/2021   Abdominal hernia 08/12/2020   Allergy 08/12/2020   Arthritis 08/12/2020   Chronic kidney disease 08/12/2020   COVID-19 08/12/2020   CVA (cerebral vascular accident) 08/12/2020   Incontinence 08/12/2020   Pneumonia 08/12/2020   Overactive bladder 10/25/2017   S/P bilateral oophorectomy 08/09/2017   BRCA gene mutation test positive 08/06/2017   Benign gastric polyp - hyperplastic 08/30/2016   Hx of adenomatous colonic polyps 08/30/2016    Genetic testing 05/17/2016   Monoallelic mutation of PALB2 gene 05/17/2016   Family history of breast cancer    Family history of genetic disease carrier    Urge incontinence of urine 08/18/2013   Menopause 08/18/2013   Restless leg syndrome 02/03/2013   Cough 02/03/2013   Anemia 11/25/2012   Need for prophylactic vaccination with combined diphtheria-tetanus-pertussis (DTP) vaccine 11/25/2012   Other and unspecified hyperlipidemia 07/01/2012   GERD (gastroesophageal reflux disease) 07/01/2012   Essential hypertension, benign 07/01/2012   H/O: CVA (cardiovascular accident)--while on ventilator 1998 left arm and right leg weakness 06/02/2011   Status post Nissen fundoplication (with gastrostomy tube placement)-complicated by esophageal perf Ballen-1998 06/02/2011   H/O tracheostomy 06/02/2011   Past Medical History:  Diagnosis Date   Abdominal hernia 15 years ago   no problems   Allergy    seasonal   Arthritis    knees   Benign gastric polyp - hyperplastic 08/30/2016   bx 07/2016   Chronic kidney disease    COVID-19    CVA (cerebral vascular accident)    Family history of breast cancer    Family history of genetic disease carrier    GERD (gastroesophageal reflux disease)    Hx of adenomatous colonic polyps 08/30/2016   Hypercholesterolemia    Hypertension    Incontinence    Osteoarthritis    Pneumonia    Stroke     Family History  Problem Relation Age of Onset   Heart disease Mother    Breast cancer Mother        dx in her 69s   Heart disease Father    COPD Father    Breast cancer Daughter 73       PALB2 +   Breast cancer Maternal Aunt 79   Cancer Sister 44       vaginal cancer   Ovarian cancer Sister    Lung disease Brother    COPD Brother    Other Daughter        PALB2 pos    Past Surgical History:  Procedure Laterality Date   ABDOMINAL HYSTERECTOMY     CHOLECYSTECTOMY     COLONOSCOPY     LAPAROTOMY N/A 08/09/2017   Procedure: LAPAROTOMY WITH BILATERAL  SALPINGOOPHERECTOMY;  Surgeon: Gerald Leitz, MD;  Location: [facility];  Service: Gynecology;  Laterality: N/A;   NISSEN FUNDOPLICATION  1998   Complicated by perforated esophagus and prolonged critical care illness   TRACHEOSTOMY  1998   UPPER GASTROINTESTINAL ENDOSCOPY     Social History   Occupational History   Occupation: homemaker  Tobacco Use   Smoking status: Never   Smokeless tobacco: Never  Vaping Use   Vaping Use: Never used  Substance and Sexual Activity   Alcohol  use: No   Drug use: No   Sexual activity: Not Currently

## 2022-06-16 ENCOUNTER — Other Ambulatory Visit: Payer: Self-pay | Admitting: Family Medicine

## 2022-06-16 DIAGNOSIS — Z0000 Encounter for general adult medical examination without abnormal findings: Secondary | ICD-10-CM

## 2022-07-21 ENCOUNTER — Ambulatory Visit
Admission: RE | Admit: 2022-07-21 | Discharge: 2022-07-21 | Disposition: A | Discharge status: Home / Self Care | Payer: Medicare PPO | Source: Ambulatory Visit | Attending: Family Medicine | Admitting: Family Medicine

## 2022-07-21 DIAGNOSIS — Z0000 Encounter for general adult medical examination without abnormal findings: Secondary | ICD-10-CM

## 2022-11-28 ENCOUNTER — Ambulatory Visit (INDEPENDENT_AMBULATORY_CARE_PROVIDER_SITE_OTHER): Payer: Medicare PPO | Admitting: Orthopedic Surgery

## 2022-11-28 ENCOUNTER — Other Ambulatory Visit (INDEPENDENT_AMBULATORY_CARE_PROVIDER_SITE_OTHER): Payer: Self-pay

## 2022-11-28 DIAGNOSIS — M79672 Pain in left foot: Secondary | ICD-10-CM | POA: Diagnosis not present

## 2022-11-28 DIAGNOSIS — M79671 Pain in right foot: Secondary | ICD-10-CM

## 2022-11-28 DIAGNOSIS — M6701 Short Achilles tendon (acquired), right ankle: Secondary | ICD-10-CM | POA: Diagnosis not present

## 2022-11-29 ENCOUNTER — Encounter: Payer: Self-pay | Admitting: Orthopedic Surgery

## 2022-11-29 NOTE — Progress Notes (Signed)
Office Visit Note   Patient: Kristie Anderson           Date of Birth: 1945/02/17           MRN: [id number] Visit Date: 11/28/2022              Requested by: Drucilla Chalet, MD [address] [facility] [place],  [place] [address] PCP: Drucilla Chalet, MD  Chief Complaint  Patient presents with   Right Foot - Pain   Left Foot - Pain      HPI: Patient is a 77 year old woman who presents with right heel pain worse than the left.  She has a history of right Achilles contracture.  Patient states she has been burning across the ball of her foot.  She has new balance sneakers.  Assessment & Plan: Visit Diagnoses:  1. Bilateral foot pain   2. Pain in right foot   3. Contracture of right Achilles tendon     Plan: Patient was given instructions and demonstrated Achilles stretching to do 3 times a day.  Recommend continue with the new balance sneakers and sole orthotics.  Follow-Up Instructions: Return if symptoms worsen or fail to improve.   Ortho Exam  Patient is alert, oriented, no adenopathy, well-dressed, normal affect, normal respiratory effort. Examination patient ambulates with external rotation of the right foot.  Examination patient has dorsiflexion 20 degrees short of neutral on the right with her knee extended and dorsiflexion of 10 degrees short of neutral on the left.  Patient has callus beneath the forefoot on the right secondary to the Achilles contracture.  The second metatarsal is prominent.  There is callus beneath the second metatarsal head.  Imaging: No results found. No images are attached to the encounter.  Labs: Lab Results  Component Value Date   REPTSTATUS 01/21/2011 FINAL 01/19/2011   CULT  01/19/2011    Multiple bacterial morphotypes present, none predominant. Suggest appropriate recollection if clinically indicated.     Lab Results  Component Value Date   ALBUMIN 3.8 07/31/2017   ALBUMIN 3.0 (L) 02/28/2014   ALBUMIN 4.1 05/20/2013     No results found for: "MG" No results found for: "VD25OH"  No results found for: "PREALBUMIN"    Latest Ref Rng & Units 08/10/2017    6:00 AM 07/31/2017   12:15 PM 02/28/2014   11:54 AM  CBC EXTENDED  WBC 4.0 - 10.5 K/uL 13.9  9.1  10.6   RBC 3.87 - 5.11 MIL/uL 3.69  4.64  4.03   Hemoglobin 12.0 - 15.0 g/dL 69.6  29.5  28.4   HCT 36.0 - 46.0 % 34.6  43.7  37.4   Platelets 150 - 400 K/uL 181  255  395   NEUT# 1.7 - 7.7 K/uL   8.6   Lymph# 0.7 - 4.0 K/uL   0.9      There is no height or weight on file to calculate BMI.  Orders:  Orders Placed This Encounter  Procedures   XR Foot 2 Views Right   XR Foot 2 Views Left   No orders of the defined types were placed in this encounter.    Procedures: No procedures performed  Clinical Data: No additional findings.  ROS:  All other systems negative, except as noted in the HPI. Review of Systems  Objective: Vital Signs: There were no vitals taken for this visit.  Specialty Comments:  No specialty comments available.  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   Incisional hernia 10/24/2021   Abdominal hernia 08/12/2020   Allergy 08/12/2020   Arthritis 08/12/2020   Chronic kidney disease 08/12/2020   COVID-19 08/12/2020   CVA (cerebral vascular accident) (HCC) 08/12/2020   Incontinence 08/12/2020   Pneumonia 08/12/2020   Overactive bladder 10/25/2017   S/P bilateral oophorectomy 08/09/2017   BRCA gene mutation test positive 08/06/2017   Benign gastric polyp - hyperplastic 08/30/2016   Hx of adenomatous colonic polyps 08/30/2016   Genetic testing 05/17/2016   Monoallelic mutation of PALB2 gene 05/17/2016   Family history of breast cancer    Family history of genetic disease carrier    Urge incontinence of urine 08/18/2013   Menopause 08/18/2013   Restless leg syndrome 02/03/2013   Cough 02/03/2013   Anemia 11/25/2012   Need for prophylactic vaccination  with combined diphtheria-tetanus-pertussis (DTP) vaccine 11/25/2012   Other and unspecified hyperlipidemia 07/01/2012   GERD (gastroesophageal reflux disease) 07/01/2012   Essential hypertension, benign 07/01/2012   H/O: CVA (cerebrovascular accident) 06/02/2011   Status post Nissen fundoplication (with gastrostomy tube placement)-complicated by esophageal perf Ballen-1998 06/02/2011   H/O tracheostomy 06/02/2011   Past Medical History:  Diagnosis Date   Abdominal hernia 15 years ago   no problems   Allergy    seasonal   Arthritis    knees   Benign gastric polyp - hyperplastic 08/30/2016   bx 07/2016   Chronic kidney disease    COVID-19    CVA (cerebral vascular accident) (HCC)    Family history of breast cancer    Family history of genetic disease carrier    GERD (gastroesophageal reflux disease)    Hx of adenomatous colonic polyps 08/30/2016   Hypercholesterolemia    Hypertension    Incontinence    Osteoarthritis    Pneumonia    Stroke (HCC)     Family History  Problem Relation Age of Onset   Heart disease Mother    Breast cancer Mother        dx in her 26s   Heart disease Father    COPD Father    Breast cancer Daughter 63       PALB2 +   Breast cancer Maternal Aunt 67   Cancer Sister 80       vaginal cancer   Ovarian cancer Sister    Lung disease Brother    COPD Brother    Other Daughter        PALB2 pos    Past Surgical History:  Procedure Laterality Date   ABDOMINAL HYSTERECTOMY     CHOLECYSTECTOMY     COLONOSCOPY     LAPAROTOMY N/A 08/09/2017   Procedure: LAPAROTOMY WITH BILATERAL SALPINGOOPHERECTOMY;  Surgeon: Gerald Leitz, MD;  Location: [facility];  Service: Gynecology;  Laterality: N/A;   NISSEN FUNDOPLICATION  1998   Complicated by perforated esophagus and prolonged critical care illness   TRACHEOSTOMY  1998   UPPER GASTROINTESTINAL ENDOSCOPY     Social History   Occupational History   Occupation: homemaker  Tobacco Use   Smoking status: Never    Smokeless tobacco: Never  Vaping Use   Vaping status: Never Used  Substance and Sexual Activity   Alcohol use: No   Drug use: No   Sexual activity: Not Currently

## 2022-12-05 ENCOUNTER — Encounter: Payer: Self-pay | Admitting: Internal Medicine

## 2023-02-09 ENCOUNTER — Encounter: Payer: Self-pay | Admitting: Cardiology

## 2023-02-09 DIAGNOSIS — R7303 Prediabetes: Secondary | ICD-10-CM

## 2023-02-09 DIAGNOSIS — D485 Neoplasm of uncertain behavior of skin: Secondary | ICD-10-CM

## 2023-02-09 DIAGNOSIS — Z4802 Encounter for removal of sutures: Secondary | ICD-10-CM

## 2023-02-09 DIAGNOSIS — M8580 Other specified disorders of bone density and structure, unspecified site: Secondary | ICD-10-CM | POA: Insufficient documentation

## 2023-02-09 DIAGNOSIS — H1712 Central corneal opacity, left eye: Secondary | ICD-10-CM | POA: Insufficient documentation

## 2023-02-09 DIAGNOSIS — D2372 Other benign neoplasm of skin of left lower limb, including hip: Secondary | ICD-10-CM

## 2023-02-09 HISTORY — DX: Central corneal opacity, left eye: H17.12

## 2023-02-09 HISTORY — DX: Neoplasm of uncertain behavior of skin: D48.5

## 2023-02-09 HISTORY — DX: Prediabetes: R73.03

## 2023-02-09 HISTORY — DX: Other specified disorders of bone density and structure, unspecified site: M85.80

## 2023-02-09 HISTORY — DX: Other benign neoplasm of skin of left lower limb, including hip: D23.72

## 2023-02-09 HISTORY — DX: Encounter for removal of sutures: Z48.02

## 2023-02-09 NOTE — Progress Notes (Signed)
Cardiology Office Note:    Date:  02/10/2023   ID:  Kristie Anderson, DOB October 30, 1945, MRN [id number]  PCP:  Drucilla Chalet, MD  Cardiologist:  Norman Herrlich, MD    Referring MD: Drucilla Chalet, MD    ASSESSMENT:    1. Essential hypertension, benign   2. Stage 3 chronic kidney disease, unspecified whether stage 3a or 3b CKD (HCC)   3. Other hyperlipidemia   4. PAD (peripheral artery disease) (HCC)    PLAN:    In order of problems listed above:  Her blood pressure good device validated and with good technique runs 120 130/70 well-controlled and continue her current treatment with ARB She tells me her kidney disease is stable labs are followed by the [facility] Continue her current high intensity statin Stable   Next appointment: I will see her back in the office as needed   Medication Adjustments/Labs and Tests Ordered: Current medicines are reviewed at length with the patient today.  Concerns regarding medicines are outlined above.  No orders of the defined types were placed in this encounter.  No orders of the defined types were placed in this encounter.    History of Present Illness:    Kristie Anderson is a 78 y.o. female with a hx of hypertension stage III CKD hyperlipidemia mild nonflow limiting stenosis of right coronary artery 40 to 59% and shortness of breath with the previous evaluation both proBNP level and echocardiogram showing no findings of heart failure or pulmonary artery hypertension last seen 12/30/2021.  Following the visit she had a duplex lower extremities which showed no evidence of DVT bilaterally.  Compliance with diet, lifestyle and medications: Yes  She was placed on Jardiance for what appears to be general cardiovascular protection she has only been taking of order of the dose daily is hesitant to take it and with the results of her echocardiogram and proBNP level she does not have heart failure and I told her I do not think it is indicated  at this point in time. He has recovered is feeling good and is having no cardiovascular symptoms of edema shortness of breath chest pain palpitation or syncope She tolerates her statin without muscle pain or weakness Past Medical History:  Diagnosis Date   Abdominal hernia 15 years ago   no problems   Acquired equinus deformity of foot 06/01/2020   Allergy    seasonal   Anemia 11/25/2012   Arthritis    knees   Benign gastric polyp - hyperplastic 08/30/2016   bx 07/2016   BRCA gene mutation test positive 08/06/2017   Last Assessment & Plan: Formatting of this note might be different from the original. It has been recommended she have her ovaries removed given her genetic testing results.     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  The 30-Gene Cancer Panel offered by [facility] includes sequencing and/or deletion duplication testing of the following 30 genes: APC, ATM, BAP1, BARD1, BMPR1A, BRCA1, BRCA2, BRIP1, CDH1, CDK4, CDKN2A (p14ARF), CDKN2A (p16INK4a), CHEK2, EPCAM, GREM1, MITF, MLH1,  MSH2, MSH6, MUTYH, NBN, PALB2, PMS2, PO   GERD (gastroesophageal reflux disease)    H/O tracheostomy 06/02/2011   H/O: CVA (cerebrovascular accident) 06/02/2011   IMO SNOMED Dx Update Oct 2024     Hx of adenomatous colonic polyps 08/30/2016   Hypercholesterolemia    Hypertension    Incisional hernia 10/24/2021   Incontinence    Insomnia 10/25/2017   Last Assessment & Plan:    We discussed things that she can do to improve her sleep quality like the following:  No  Caffeine; Dark/Cool Room; Sleepy Time vs Sweet Dreams Herbal Tea; Consistent time for going to bed and rising; Melatonin (Vita Fusion Gummy 5 mg [Blackberry]); Writing down things that are on your mind; Cognitive Behavioral Therapy.     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 She will continue on oxybutynin 5 mg twice a day.     Pneumonia    Pneumonitis due to inhalation of food or vomitus (HCC) 06/01/2020   Prediabetes 02/09/2023   Restless leg syndrome 02/03/2013   S/P bilateral oophorectomy 08/09/2017   Status post Nissen fundoplication (with gastrostomy tube placement)-complicated by esophageal perf Ballen-1998 06/02/2011   Stroke (HCC)    Urge incontinence of urine 08/18/2013    Current Medications: Current Meds  Medication Sig   acetaminophen (TYLENOL) 500 MG tablet Take 2 tablets (1,000 mg total) by mouth every 8 (eight) hours as needed for moderate pain.   aspirin EC 81 MG tablet Take 1 tablet (81 mg total) by mouth daily. Swallow whole.   cetirizine (ZYRTEC) 10 MG tablet Take 10 mg by mouth at bedtime.    chlorpheniramine-HYDROcodone (TUSSIONEX) 10-8 MG/5ML Take 5 mLs by mouth at bedtime as needed.   empagliflozin (JARDIANCE) 25 MG TABS tablet Take 12.5 mg by mouth daily. EVERY MORNING FOR KIDNEYS, BLOOD SUGAR AND HEART   famotidine (PEPCID) 40 MG tablet Take 40 mg by mouth daily.   irbesartan (AVAPRO) 75 MG tablet Take 1 tablet (75 mg total) by mouth daily.   montelukast  (SINGULAIR) 10 MG tablet Take 10 mg by mouth at bedtime.   ondansetron (ZOFRAN-ODT) 8 MG disintegrating tablet Take 8 mg by mouth 2 (two) times daily as needed for nausea.   oxybutynin (DITROPAN) 5 MG tablet Take 1 tablet by mouth 2 (two) times daily.   pantoprazole (PROTONIX) 40 MG tablet Take 40 mg by mouth daily.   promethazine (PHENERGAN) 25 MG tablet Take 25 mg by mouth at bedtime as needed for nausea.   pseudoephedrine (SUDAFED) 60 MG tablet Take 0.5 tablets by mouth daily.   rOPINIRole (REQUIP) 1 MG tablet Take 1 mg by mouth 2 (two) times daily.      EKGs/Labs/Other Studies Reviewed:    The following studies were reviewed today:  Cardiac Studies & Procedures      ECHOCARDIOGRAM  ECHOCARDIOGRAM COMPLETE 09/04/2020  Narrative ECHOCARDIOGRAM REPORT    Patient Name:   LOLAMAE BAUMHOVER Date of Exam: 09/04/2020 Medical Rec #:  [id number]  Height:       62.0 in Accession #:    [id number]     Weight:       159.0 lb Date of Birth:  1945-07-23      BSA:          1.734 m Patient Age:    78 years       BP:           131/65 mmHg Patient Gender: F              HR:           64 bpm. Exam Location:  Outpatient  Procedure: 2D Echo, Cardiac Doppler and Color Doppler  Indications:    Dyspnea  History:        Patient has no prior history of Echocardiogram examinations. Carotid Disease, Signs/Symptoms:Shortness of Breath and Lung disease; Risk Factors:Hypertension and Dyslipidemia.  Sonographer:    Lavenia Atlas RDCS Referring Phys: [id number] Chancey Cullinane J Glema Takaki  IMPRESSIONS   1. Left ventricular ejection fraction, by estimation, is 60 to 65%. The left ventricle has normal function. The left ventricle has no regional wall motion abnormalities. Left ventricular diastolic parameters are indeterminate. 2. Right ventricular systolic function is normal. The right ventricular size is normal. There is normal pulmonary artery systolic pressure. 3. The mitral valve is normal in structure.  Trivial mitral valve regurgitation. No evidence of mitral stenosis. Moderate mitral annular calcification. 4. The aortic valve is grossly normal. There is mild calcification of the aortic valve. Aortic valve regurgitation is not visualized. Mild aortic valve sclerosis is present, with no evidence of aortic valve stenosis. 5. The inferior vena cava is normal in size with greater than 50% respiratory variability, suggesting right atrial pressure of 3 mmHg.  Comparison(s): No prior Echocardiogram.  Conclusion(s)/Recommendation(s): Normal biventricular function without evidence of hemodynamically significant valvular heart disease.  FINDINGS Left Ventricle: Left ventricular ejection fraction, by estimation, is 60 to 65%. The left ventricle has normal function. The left ventricle has no regional wall motion abnormalities. The left ventricular internal cavity size was normal in size. There is no left ventricular hypertrophy. Left ventricular diastolic parameters are indeterminate.  Right Ventricle: The right ventricular size is normal. No increase in right ventricular wall thickness. Right ventricular systolic function is normal. There is normal pulmonary artery systolic pressure. The tricuspid regurgitant velocity is 1.98 m/s, and with an assumed right atrial pressure of 3 mmHg, the estimated right ventricular systolic pressure is 18.7 mmHg.  Left Atrium: Left atrial size was normal in size.  Right Atrium: Right atrial size was normal in size.  Pericardium: There is no evidence of pericardial effusion.  Mitral Valve: The mitral valve is normal in structure. Moderate mitral annular calcification. Trivial mitral valve regurgitation. No evidence of mitral valve stenosis.  Tricuspid Valve: The tricuspid valve is normal in structure. Tricuspid valve regurgitation is trivial. No evidence of tricuspid stenosis.  Aortic Valve: The aortic valve is grossly normal. There is mild calcification of the aortic  valve. Aortic valve regurgitation is not visualized. Mild aortic valve sclerosis is present, with no evidence of aortic valve stenosis.  Pulmonic Valve: The pulmonic valve was not well visualized. Pulmonic valve regurgitation is not visualized. No evidence of pulmonic stenosis.  Aorta: The aortic root, ascending aorta and aortic arch are all structurally normal, with no evidence of dilitation or obstruction.  Venous: The inferior vena cava is normal in size with greater than 50% respiratory variability, suggesting right atrial pressure of 3 mmHg.  IAS/Shunts: No atrial level shunt detected by color flow Doppler.   LEFT VENTRICLE PLAX 2D LVIDd:         3.90 cm     Diastology LVIDs:         2.00 cm     LV e' medial:    6.42 cm/s LV PW:         0.90 cm     LV E/e' medial:  15.2 LV IVS:        0.90 cm     LV e' lateral:   6.53 cm/s LVOT diam:     1.80 cm     LV E/e' lateral: 14.9 LV SV:         41 LV SV Index:   24 LVOT Area:     2.54 cm  LV Volumes (MOD) LV vol d, MOD A4C: 64.5 ml LV vol s, MOD A4C: 21.8 ml LV SV MOD A4C:     64.5 ml  RIGHT VENTRICLE RV Basal diam:  2.70 cm RV S prime:     14.10 cm/s TAPSE (M-mode): 2.0 cm  LEFT ATRIUM             Index       RIGHT ATRIUM          Index LA diam:        3.15 cm 1.82 cm/m  RA Area:     8.36 cm LA Vol (A2C):   27.5 ml 15.86 ml/m RA Volume:   16.00 ml 9.23 ml/m LA Vol (A4C):   20.7 ml 11.94 ml/m LA Biplane Vol: 24.1 ml 13.90 ml/m AORTIC VALVE LVOT Vmax:   78.30 cm/s LVOT Vmean:  52.200 cm/s LVOT VTI:    0.161 m  AORTA Ao Root diam: 2.60 cm  MITRAL VALVE                TRICUSPID VALVE MV Area (PHT): 4.49 cm     TR Peak grad:   15.7 mmHg MV Decel Time: 169 msec     TR Vmax:        198.00 cm/s MV E velocity: 97.40 cm/s MV A velocity: 108.00 cm/s  SHUNTS MV E/A ratio:  0.90         Systemic VTI:  0.16 m Systemic Diam: 1.80 cm  Jodelle Red MD Electronically signed by Jodelle Red MD Signature  Date/Time: 09/04/2020/12:56:30 PM    Final                 Recent Labs: No results found for requested labs within last 365 days.  Recent Lipid Panel    Component Value Date/Time   CHOL 169 05/20/2013 0952   TRIG 83 05/20/2013 0952   HDL 61 05/20/2013 0952   CHOLHDL 2.8 05/20/2013 0952   VLDL 17 05/20/2013 0952   LDLCALC 91 05/20/2013 0952   She declined an EKG Physical Exam:    VS:  BP (!) 152/76   Pulse 71   Ht 5\' 2"  (1.575 m)   Wt 143 lb 9.6 oz (65.1 kg)   SpO2 95%   BMI 26.26 kg/m     Wt Readings from Last 3 Encounters:  02/10/23 143 lb 9.6 oz (65.1 kg)  12/30/21 151 lb 9.6 oz (68.8 kg)  10/20/20 162 lb (73.5 kg)     GEN:  Well nourished, well developed in no acute distress HEENT: Normal NECK: No JVD; No carotid bruits LYMPHATICS: No lymphadenopathy CARDIAC: RRR, no murmurs, rubs, gallops RESPIRATORY:  Clear to  auscultation without rales, wheezing or rhonchi  ABDOMEN: Soft, non-tender, non-distended MUSCULOSKELETAL:  No edema; No deformity  SKIN: Warm and dry NEUROLOGIC:  Alert and oriented x 3 PSYCHIATRIC:  Normal affect    Signed, Norman Herrlich, MD  02/10/2023 3:18 PM    [facility]

## 2023-02-10 ENCOUNTER — Ambulatory Visit: Payer: Medicare PPO | Attending: Cardiology | Admitting: Cardiology

## 2023-02-10 ENCOUNTER — Encounter: Payer: Self-pay | Admitting: Cardiology

## 2023-02-10 VITALS — BP 136/70 | HR 71 | Ht 62.0 in | Wt 143.6 lb

## 2023-02-10 DIAGNOSIS — I739 Peripheral vascular disease, unspecified: Secondary | ICD-10-CM

## 2023-02-10 DIAGNOSIS — E7849 Other hyperlipidemia: Secondary | ICD-10-CM | POA: Diagnosis not present

## 2023-02-10 DIAGNOSIS — I10 Essential (primary) hypertension: Secondary | ICD-10-CM

## 2023-02-10 DIAGNOSIS — N183 Chronic kidney disease, stage 3 unspecified: Secondary | ICD-10-CM

## 2023-02-10 NOTE — Patient Instructions (Addendum)
Medication Instructions:  Your physician has recommended you make the following change in your medication:   STOP: Jardiance  *If you need a refill on your cardiac medications before your next appointment, please call your pharmacy*   Lab Work: None If you have labs (blood work) drawn today and your tests are completely normal, you will receive your results only by: MyChart Message (if you have MyChart) OR A paper copy in the mail If you have any lab test that is abnormal or we need to change your treatment, we will call you to review the results.   Testing/Procedures: None   Follow-Up: At [facility], you and your health needs are our priority.  As part of our continuing mission to provide you with exceptional heart care, we have created designated Provider Care Teams.  These Care Teams include your primary Cardiologist (physician) and Advanced Practice Providers (APPs -  Physician Assistants and Nurse Practitioners) who all work together to provide you with the care you need, when you need it.  We recommend signing up for the patient portal called "MyChart".  Sign up information is provided on this After Visit Summary.  MyChart is used to connect with patients for Virtual Visits (Telemedicine).  Patients are able to view lab/test results, encounter notes, upcoming appointments, etc.  Non-urgent messages can be sent to your provider as well.   To learn more about what you can do with MyChart, go to [email or web address].    Your next appointment:   Follow up as needed   Provider:   Norman Herrlich, MD    Other Instructions None

## 2023-05-07 ENCOUNTER — Emergency Department (HOSPITAL_COMMUNITY)
Admission: EM | Admit: 2023-05-07 | Discharge: 2023-05-07 | Disposition: A | Discharge status: Home / Self Care | Attending: Emergency Medicine | Admitting: Emergency Medicine

## 2023-05-07 ENCOUNTER — Other Ambulatory Visit: Payer: Self-pay

## 2023-05-07 ENCOUNTER — Emergency Department (HOSPITAL_COMMUNITY)

## 2023-05-07 ENCOUNTER — Encounter (HOSPITAL_COMMUNITY): Payer: Self-pay | Admitting: Emergency Medicine

## 2023-05-07 DIAGNOSIS — R55 Syncope and collapse: Secondary | ICD-10-CM | POA: Insufficient documentation

## 2023-05-07 DIAGNOSIS — Z79899 Other long term (current) drug therapy: Secondary | ICD-10-CM | POA: Insufficient documentation

## 2023-05-07 DIAGNOSIS — R7989 Other specified abnormal findings of blood chemistry: Secondary | ICD-10-CM | POA: Insufficient documentation

## 2023-05-07 DIAGNOSIS — N189 Chronic kidney disease, unspecified: Secondary | ICD-10-CM | POA: Diagnosis not present

## 2023-05-07 DIAGNOSIS — R110 Nausea: Secondary | ICD-10-CM | POA: Insufficient documentation

## 2023-05-07 DIAGNOSIS — Z7982 Long term (current) use of aspirin: Secondary | ICD-10-CM | POA: Diagnosis not present

## 2023-05-07 DIAGNOSIS — D72829 Elevated white blood cell count, unspecified: Secondary | ICD-10-CM | POA: Diagnosis not present

## 2023-05-07 DIAGNOSIS — I129 Hypertensive chronic kidney disease with stage 1 through stage 4 chronic kidney disease, or unspecified chronic kidney disease: Secondary | ICD-10-CM | POA: Insufficient documentation

## 2023-05-07 LAB — CBC WITH DIFFERENTIAL/PLATELET
Abs Immature Granulocytes: 0.04 10*3/uL (ref 0.00–0.07)
Basophils Absolute: 0.1 10*3/uL (ref 0.0–0.1)
Basophils Relative: 1 %
Eosinophils Absolute: 0.2 10*3/uL (ref 0.0–0.5)
Eosinophils Relative: 1 %
HCT: 41.1 % (ref 36.0–46.0)
Hemoglobin: 13.1 g/dL (ref 12.0–15.0)
Immature Granulocytes: 0 %
Lymphocytes Relative: 10 %
Lymphs Abs: 1.3 10*3/uL (ref 0.7–4.0)
MCH: 31.3 pg (ref 26.0–34.0)
MCHC: 31.9 g/dL (ref 30.0–36.0)
MCV: 98.1 fL (ref 80.0–100.0)
Monocytes Absolute: 0.7 10*3/uL (ref 0.1–1.0)
Monocytes Relative: 6 %
Neutro Abs: 10.8 10*3/uL — ABNORMAL HIGH (ref 1.7–7.7)
Neutrophils Relative %: 82 %
Platelets: 217 10*3/uL (ref 150–400)
RBC: 4.19 MIL/uL (ref 3.87–5.11)
RDW: 13.1 % (ref 11.5–15.5)
WBC: 13.1 10*3/uL — ABNORMAL HIGH (ref 4.0–10.5)
nRBC: 0 % (ref 0.0–0.2)

## 2023-05-07 LAB — URINALYSIS, ROUTINE W REFLEX MICROSCOPIC
Bacteria, UA: NONE SEEN
Bilirubin Urine: NEGATIVE
Glucose, UA: NEGATIVE mg/dL
Hgb urine dipstick: NEGATIVE
Ketones, ur: NEGATIVE mg/dL
Nitrite: NEGATIVE
Protein, ur: NEGATIVE mg/dL
Specific Gravity, Urine: 1.013 (ref 1.005–1.030)
pH: 5 (ref 5.0–8.0)

## 2023-05-07 LAB — CBG MONITORING, ED: Glucose-Capillary: 140 mg/dL — ABNORMAL HIGH (ref 70–99)

## 2023-05-07 LAB — COMPREHENSIVE METABOLIC PANEL WITH GFR
ALT: 24 U/L (ref 0–44)
AST: 28 U/L (ref 15–41)
Albumin: 3.4 g/dL — ABNORMAL LOW (ref 3.5–5.0)
Alkaline Phosphatase: 73 U/L (ref 38–126)
Anion gap: 7 (ref 5–15)
BUN: 18 mg/dL (ref 8–23)
CO2: 24 mmol/L (ref 22–32)
Calcium: 8.8 mg/dL — ABNORMAL LOW (ref 8.9–10.3)
Chloride: 104 mmol/L (ref 98–111)
Creatinine, Ser: 1.08 mg/dL — ABNORMAL HIGH (ref 0.44–1.00)
GFR, Estimated: 53 mL/min — ABNORMAL LOW (ref >60)
Glucose, Bld: 147 mg/dL — ABNORMAL HIGH (ref 70–99)
Potassium: 3.9 mmol/L (ref 3.5–5.1)
Sodium: 135 mmol/L (ref 135–145)
Total Bilirubin: 0.6 mg/dL (ref 0.0–1.2)
Total Protein: 6.7 g/dL (ref 6.5–8.1)

## 2023-05-07 LAB — TROPONIN I (HIGH SENSITIVITY): Troponin I (High Sensitivity): 2 ng/L (ref <18)

## 2023-05-07 MED ORDER — ONDANSETRON 4 MG PO TBDP: 4.0000 mg | ORAL_TABLET | Freq: Four times a day (QID) | ORAL | 0 refills | Status: AC | PRN

## 2023-05-07 MED ORDER — ONDANSETRON HCL 4 MG/2ML IJ SOLN
4.0000 mg | Freq: Once | INTRAMUSCULAR | Status: AC
  Administered 2023-05-07: 4 mg via INTRAVENOUS
  Filled 2023-05-07: qty 2

## 2023-05-07 MED ORDER — ACETAMINOPHEN 325 MG PO TABS
650.0000 mg | ORAL_TABLET | Freq: Once | ORAL | Status: AC
  Administered 2023-05-07: 650 mg via ORAL
  Filled 2023-05-07: qty 2

## 2023-05-07 MED ORDER — SODIUM CHLORIDE 0.9 % IV BOLUS
1000.0000 mL | Freq: Once | INTRAVENOUS | Status: AC
  Administered 2023-05-07: 1000 mL via INTRAVENOUS

## 2023-05-07 NOTE — ED Triage Notes (Signed)
 Patient bib [facility] for syncopal episode at [facility] with nausea unable to vomit.  Headache noted per patient.  She has had foot poisoning in the past.  Weakness

## 2023-05-07 NOTE — Discharge Instructions (Signed)
 Please drink plenty fluids, you can use your nausea medication every 6 hours as needed, please follow-up with your primary care doctor if you continue to have any lightheadedness, nausea.

## 2023-05-07 NOTE — ED Provider Notes (Signed)
 [place] EMERGENCY DEPARTMENT AT [facility] Provider Note   CSN: [id number] Arrival date & time: 05/07/23  1443     History  Chief Complaint  Patient presents with   Loss of Consciousness    Kristie Anderson is a 78 y.o. female with past medical history significant for GERD, status post Nissen fundoplication, hypertension, CKD who presents after she was eating at chips, she had a syncopal episode.  She reports that she is nauseous, had a headache.  She reports that she feels like she has food poisoning and this feels similar to the food poisoning that she has had in the past.  She reports generalized weakness.  She denies any chest pain, shortness of breath.  She denies any numbness, tingling, vision changes.   Loss of Consciousness      Home Medications Prior to Admission medications   Medication Sig Start Date End Date Taking? Authorizing Provider  ondansetron (ZOFRAN-ODT) 4 MG disintegrating tablet Take 1 tablet (4 mg total) by mouth every 6 (six) hours as needed for nausea or vomiting. 05/07/23  Yes Prestyn Stanco H, PA-C  acetaminophen (TYLENOL) 500 MG tablet Take 2 tablets (1,000 mg total) by mouth every 8 (eight) hours as needed for moderate pain. 08/10/17   Varnado, Evelyn, MD  aspirin EC 81 MG tablet Take 1 tablet (81 mg total) by mouth daily. Swallow whole. 09/04/20   Hassan Links, MD  cetirizine (ZYRTEC) 10 MG tablet Take 10 mg by mouth at bedtime.     [provider]  chlorpheniramine-HYDROcodone (TUSSIONEX) 10-8 MG/5ML Take 5 mLs by mouth at bedtime as needed. 06/11/20   [provider]  famotidine (PEPCID) 40 MG tablet Take 40 mg by mouth daily.    [provider]  irbesartan (AVAPRO) 75 MG tablet Take 1 tablet (75 mg total) by mouth daily. 11/22/21   Hassan Links, MD  montelukast (SINGULAIR) 10 MG tablet Take 10 mg by mouth at bedtime.    [provider]  oxybutynin (DITROPAN) 5 MG tablet Take 1 tablet by mouth 2  (two) times daily. 07/09/18   [provider]  pantoprazole (PROTONIX) 40 MG tablet Take 40 mg by mouth daily. 06/01/20   [provider]  promethazine (PHENERGAN) 25 MG tablet Take 25 mg by mouth at bedtime as needed for nausea. 08/06/19   [provider]  pseudoephedrine (SUDAFED) 60 MG tablet Take 0.5 tablets by mouth daily. 06/15/06   [provider]  rOPINIRole (REQUIP) 1 MG tablet Take 1 mg by mouth 2 (two) times daily.    [provider]  rosuvastatin (CRESTOR) 40 MG tablet Take 40 mg by mouth at bedtime.  09/23/15 12/30/21  [provider]      Allergies    Simvastatin    Review of Systems   Review of Systems  Cardiovascular:  Positive for syncope.  All other systems reviewed and are negative.   Physical Exam Updated Vital Signs BP (!) 123/59   Pulse 83   Temp 97.7 F (36.5 C) (Oral)   Resp 18   Ht 5\' 2"  (1.575 m)   Wt 66.2 kg   SpO2 99%   BMI 26.70 kg/m  Physical Exam Vitals and nursing note reviewed.  Constitutional:      General: She is not in acute distress.    Appearance: Normal appearance.  HENT:     Head: Normocephalic and atraumatic.  Eyes:     General:  Right eye: No discharge.        Left eye: No discharge.  Cardiovascular:     Rate and Rhythm: Normal rate and regular rhythm.     Heart sounds: No murmur heard.    No friction rub. No gallop.  Pulmonary:     Effort: Pulmonary effort is normal.     Breath sounds: Normal breath sounds.  Abdominal:     General: Bowel sounds are normal.     Palpations: Abdomen is soft.  Skin:    General: Skin is warm and dry.     Capillary Refill: Capillary refill takes less than 2 seconds.  Neurological:     Mental Status: She is alert and oriented to person, place, and time.     Comments: Moves all 4 limbs spontaneously, CN II through XII grossly intact, can ambulate without difficulty, intact sensation throughout.  Psychiatric:        Mood and Affect: Mood  normal.        Behavior: Behavior normal.     ED Results / Procedures / Treatments   Labs (all labs ordered are listed, but only abnormal results are displayed) Labs Reviewed  CBC WITH DIFFERENTIAL/PLATELET - Abnormal; Notable for the following components:      Result Value   WBC 13.1 (*)    Neutro Abs 10.8 (*)    All other components within normal limits  COMPREHENSIVE METABOLIC PANEL WITH GFR - Abnormal; Notable for the following components:   Glucose, Bld 147 (*)    Creatinine, Ser 1.08 (*)    Calcium 8.8 (*)    Albumin 3.4 (*)    GFR, Estimated 53 (*)    All other components within normal limits  URINALYSIS, ROUTINE W REFLEX MICROSCOPIC - Abnormal; Notable for the following components:   Leukocytes,Ua TRACE (*)    All other components within normal limits  CBG MONITORING, ED - Abnormal; Notable for the following components:   Glucose-Capillary 140 (*)    All other components within normal limits  TROPONIN I (HIGH SENSITIVITY)    EKG EKG Interpretation Date/Time:  Sunday May 07 2023 14:59:06 EDT Ventricular Rate:  65 PR Interval:  192 QRS Duration:  84 QT Interval:  414 QTC Calculation: 431 R Axis:   35  Text Interpretation: Sinus rhythm Low voltage, precordial leads since last tracing no significant change Confirmed by Hershel Los [phone]) on 05/07/2023 5:49:42 PM  Radiology CT Head Wo Contrast Result Date: 05/07/2023 CLINICAL DATA:  Headache, new onset.  Syncopal episode with nausea. EXAM: CT HEAD WITHOUT CONTRAST TECHNIQUE: Contiguous axial images were obtained from the base of the skull through the vertex without intravenous contrast. RADIATION DOSE REDUCTION: This exam was performed according to the departmental dose-optimization program which includes automated exposure control, adjustment of the mA and/or kV according to patient size and/or use of iterative reconstruction technique. COMPARISON:  None Available. FINDINGS: Brain: No acute intracranial  hemorrhage, midline shift or mass effect. No extra-axial fluid collection. Periventricular white matter hypodensities are present bilaterally. No hydrocephalus. Vascular: No hyperdense vessel or unexpected calcification. Skull: Normal. Negative for fracture or focal lesion. Sinuses/Orbits: There is near complete opacification of the frontal sinus, ethmoid air cells, and maxillary sinus on the left. No acute orbital abnormality. Other: None. IMPRESSION: 1. No acute intracranial process. 2. Chronic microvascular ischemic changes. Electronically Signed   By: Wyvonnia Heimlich M.D.   On: 05/07/2023 16:23    Procedures Procedures    Medications Ordered in ED Medications  sodium chloride  0.9 % bolus 1,000 mL (1,000 mLs Intravenous New Bag/Given 05/07/23 1542)  ondansetron (ZOFRAN) injection 4 mg (4 mg Intravenous Given 05/07/23 1541)  acetaminophen (TYLENOL) tablet 650 mg (650 mg Oral Given 05/07/23 1537)    ED Course/ Medical Decision Making/ A&P                                 Medical Decision Making Amount and/or Complexity of Data Reviewed Labs: ordered. Radiology: ordered.  Risk OTC drugs. Prescription drug management.   This patient is a 78 y.o. female  who presents to the ED for concern of syncope.   Differential diagnoses prior to evaluation: The emergent differential diagnosis includes, but is not limited to,  CVA, ACS, arrhythmia, vasovagal / orthostatic hypotension, sepsis, hypoglycemia, electrolyte disturbance, respiratory failure, anemia, dehydration, heat injury, polypharmacy, malignancy, anxiety/panic attack . This is not an exhaustive differential.   Past Medical History / Co-morbidities / Social History:  GERD, status post Nissen fundoplication, hypertension, CKD  Physical Exam: Physical exam performed. The pertinent findings include: Moves all 4 limbs spontaneously, CN II through XII grossly intact, can ambulate without difficulty, intact sensation throughout.   Vital  signs overall stable throughout.  Lab Tests/Imaging studies: I personally interpreted labs/imaging and the pertinent results include: CBC with mild leukocytosis, blood cell 13.1, CMP overall unremarkable, mildly elevated creatinine 1.08, otherwise normal, UA overall unremarkable, initial troponin shows 2.  I independently ordered CT head without contrast which shows no evidence of acute intracranial abnormality.  I agree with the radiologist interpretation.  Cardiac monitoring: EKG obtained and interpreted by myself and attending physician which shows: NSR no significant change from baseline   Medications: I ordered medication including tylenol for pain, zofran for nausea, fluid bolus after syncope.  I have reviewed the patients home medicines and have made adjustments as needed.   Disposition: After consideration of the diagnostic results and the patients response to treatment, I feel that patient with no acute cardiac or intracranial explanation for her syncope, suspect vasovagal syncope related to her dry heaving without vomiting. Feeling improved, reassuring labwork   emergency department workup does not suggest an emergent condition requiring admission or immediate intervention beyond what has been performed at this time. The plan is: as above. The patient is safe for discharge and has been instructed to return immediately for worsening symptoms, change in symptoms or any other concerns.  Final Clinical Impression(s) / ED Diagnoses Final diagnoses:  Syncope and collapse  Nausea    Rx / DC Orders ED Discharge Orders          Ordered    ondansetron (ZOFRAN-ODT) 4 MG disintegrating tablet  Every 6 hours PRN        05/07/23 1914              Mikael Skoda H, PA-C 05/07/23 1916    Hershel Los, MD 05/07/23 2317

## 2023-06-28 ENCOUNTER — Other Ambulatory Visit: Payer: Self-pay

## 2023-06-28 DIAGNOSIS — Z1231 Encounter for screening mammogram for malignant neoplasm of breast: Secondary | ICD-10-CM

## 2023-07-24 ENCOUNTER — Ambulatory Visit

## 2023-07-31 ENCOUNTER — Ambulatory Visit
Admission: RE | Admit: 2023-07-31 | Discharge: 2023-07-31 | Disposition: A | Discharge status: Home / Self Care | Source: Ambulatory Visit

## 2023-07-31 DIAGNOSIS — Z1231 Encounter for screening mammogram for malignant neoplasm of breast: Secondary | ICD-10-CM

## 2023-08-04 ENCOUNTER — Other Ambulatory Visit: Payer: Self-pay

## 2023-08-04 DIAGNOSIS — R928 Other abnormal and inconclusive findings on diagnostic imaging of breast: Secondary | ICD-10-CM

## 2023-08-14 ENCOUNTER — Ambulatory Visit
Admission: RE | Admit: 2023-08-14 | Discharge: 2023-08-14 | Disposition: A | Discharge status: Home / Self Care | Source: Ambulatory Visit

## 2023-08-14 ENCOUNTER — Other Ambulatory Visit: Payer: Self-pay

## 2023-08-14 DIAGNOSIS — N6310 Unspecified lump in the right breast, unspecified quadrant: Secondary | ICD-10-CM

## 2023-08-14 DIAGNOSIS — R928 Other abnormal and inconclusive findings on diagnostic imaging of breast: Secondary | ICD-10-CM

## 2023-08-21 ENCOUNTER — Ambulatory Visit
Admission: RE | Admit: 2023-08-21 | Discharge: 2023-08-21 | Disposition: A | Discharge status: Home / Self Care | Source: Ambulatory Visit

## 2023-08-21 DIAGNOSIS — N6310 Unspecified lump in the right breast, unspecified quadrant: Secondary | ICD-10-CM

## 2023-08-21 HISTORY — PX: BREAST BIOPSY: SHX20

## 2023-08-22 LAB — SURGICAL PATHOLOGY

## 2023-11-27 ENCOUNTER — Encounter: Payer: Self-pay | Admitting: Radiology
